# Patient Record
Sex: Female | Born: 1980 | Race: White | Hispanic: No | Smoking: Former smoker
Health system: Southern US, Community
[De-identification: ages and names within clinical notes are randomized; demographics above are authoritative.]

## PROBLEM LIST (undated history)

## (undated) DIAGNOSIS — T7840XA Allergy, unspecified, initial encounter: Secondary | ICD-10-CM

## (undated) DIAGNOSIS — N926 Irregular menstruation, unspecified: Secondary | ICD-10-CM

## (undated) DIAGNOSIS — R011 Cardiac murmur, unspecified: Secondary | ICD-10-CM

## (undated) DIAGNOSIS — E042 Nontoxic multinodular goiter: Secondary | ICD-10-CM

## (undated) HISTORY — DX: Allergy, unspecified, initial encounter: T78.40XA

## (undated) HISTORY — DX: Irregular menstruation, unspecified: N92.6

## (undated) HISTORY — DX: Nontoxic multinodular goiter: E04.2

## (undated) HISTORY — DX: Cardiac murmur, unspecified: R01.1

---
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
## 2001-08-17 ENCOUNTER — Emergency Department (HOSPITAL_COMMUNITY): Admission: EM | Admit: 2001-08-17 | Discharge: 2001-08-17 | Payer: Self-pay | Admitting: Emergency Medicine

## 2001-08-17 ENCOUNTER — Encounter: Payer: Self-pay | Admitting: Emergency Medicine

## 2002-06-09 ENCOUNTER — Emergency Department (HOSPITAL_COMMUNITY): Admission: EM | Admit: 2002-06-09 | Discharge: 2002-06-09 | Payer: Self-pay

## 2002-10-27 ENCOUNTER — Emergency Department (HOSPITAL_COMMUNITY): Admission: EM | Admit: 2002-10-27 | Discharge: 2002-10-27 | Payer: Self-pay | Admitting: Emergency Medicine

## 2003-10-28 ENCOUNTER — Emergency Department (HOSPITAL_COMMUNITY): Admission: EM | Admit: 2003-10-28 | Discharge: 2003-10-28 | Payer: Self-pay | Admitting: *Deleted

## 2004-07-07 ENCOUNTER — Other Ambulatory Visit: Admission: RE | Admit: 2004-07-07 | Discharge: 2004-07-07 | Payer: Self-pay | Admitting: Obstetrics and Gynecology

## 2005-08-16 ENCOUNTER — Other Ambulatory Visit: Admission: RE | Admit: 2005-08-16 | Discharge: 2005-08-16 | Payer: Self-pay | Admitting: Obstetrics and Gynecology

## 2011-08-17 ENCOUNTER — Ambulatory Visit (INDEPENDENT_AMBULATORY_CARE_PROVIDER_SITE_OTHER): Payer: BC Managed Care – PPO

## 2011-08-17 DIAGNOSIS — Z4802 Encounter for removal of sutures: Secondary | ICD-10-CM

## 2011-09-15 ENCOUNTER — Ambulatory Visit (INDEPENDENT_AMBULATORY_CARE_PROVIDER_SITE_OTHER): Payer: BC Managed Care – PPO | Admitting: Family Medicine

## 2011-09-15 DIAGNOSIS — IMO0002 Reserved for concepts with insufficient information to code with codable children: Secondary | ICD-10-CM

## 2011-09-25 ENCOUNTER — Encounter (HOSPITAL_COMMUNITY): Payer: Self-pay

## 2011-09-25 ENCOUNTER — Emergency Department (HOSPITAL_COMMUNITY)
Admission: EM | Admit: 2011-09-25 | Discharge: 2011-09-25 | Disposition: A | Discharge status: Home / Self Care | Payer: BC Managed Care – PPO | Attending: Emergency Medicine | Admitting: Emergency Medicine

## 2011-09-25 DIAGNOSIS — R079 Chest pain, unspecified: Secondary | ICD-10-CM | POA: Insufficient documentation

## 2011-09-25 DIAGNOSIS — M542 Cervicalgia: Secondary | ICD-10-CM | POA: Insufficient documentation

## 2011-09-25 DIAGNOSIS — B9789 Other viral agents as the cause of diseases classified elsewhere: Secondary | ICD-10-CM | POA: Insufficient documentation

## 2011-09-25 DIAGNOSIS — S139XXA Sprain of joints and ligaments of unspecified parts of neck, initial encounter: Secondary | ICD-10-CM | POA: Insufficient documentation

## 2011-09-25 DIAGNOSIS — IMO0001 Reserved for inherently not codable concepts without codable children: Secondary | ICD-10-CM | POA: Insufficient documentation

## 2011-09-25 DIAGNOSIS — R05 Cough: Secondary | ICD-10-CM | POA: Insufficient documentation

## 2011-09-25 DIAGNOSIS — R070 Pain in throat: Secondary | ICD-10-CM | POA: Insufficient documentation

## 2011-09-25 DIAGNOSIS — B349 Viral infection, unspecified: Secondary | ICD-10-CM

## 2011-09-25 DIAGNOSIS — J3489 Other specified disorders of nose and nasal sinuses: Secondary | ICD-10-CM | POA: Insufficient documentation

## 2011-09-25 DIAGNOSIS — X58XXXA Exposure to other specified factors, initial encounter: Secondary | ICD-10-CM | POA: Insufficient documentation

## 2011-09-25 DIAGNOSIS — R059 Cough, unspecified: Secondary | ICD-10-CM | POA: Insufficient documentation

## 2011-09-25 DIAGNOSIS — S161XXA Strain of muscle, fascia and tendon at neck level, initial encounter: Secondary | ICD-10-CM

## 2011-09-25 DIAGNOSIS — R6883 Chills (without fever): Secondary | ICD-10-CM | POA: Insufficient documentation

## 2011-09-25 LAB — CBC
Hemoglobin: 12.3 g/dL (ref 12.0–15.0)
MCH: 30.3 pg (ref 26.0–34.0)
MCV: 88.7 fL (ref 78.0–100.0)
RBC: 4.06 MIL/uL (ref 3.87–5.11)
WBC: 9.3 10*3/uL (ref 4.0–10.5)

## 2011-09-25 LAB — DIFFERENTIAL
Eosinophils Absolute: 0 10*3/uL (ref 0.0–0.7)
Eosinophils Relative: 0 % (ref 0–5)
Lymphocytes Relative: 7 % — ABNORMAL LOW (ref 12–46)
Lymphs Abs: 0.7 10*3/uL (ref 0.7–4.0)
Monocytes Relative: 4 % (ref 3–12)
Neutrophils Relative %: 88 % — ABNORMAL HIGH (ref 43–77)

## 2011-09-25 LAB — POCT I-STAT, CHEM 8
BUN: <3 mg/dL — ABNORMAL LOW (ref 6–23)
Creatinine, Ser: 0.6 mg/dL (ref 0.50–1.10)
Potassium: 3.4 mEq/L — ABNORMAL LOW (ref 3.5–5.1)
Sodium: 141 mEq/L (ref 135–145)

## 2011-09-25 MED ORDER — CYCLOBENZAPRINE HCL 10 MG PO TABS: 10.0000 mg | ORAL_TABLET | Freq: Two times a day (BID) | ORAL | Status: AC | PRN

## 2011-09-25 MED ORDER — METHOCARBAMOL 500 MG PO TABS
500.0000 mg | ORAL_TABLET | Freq: Once | ORAL | Status: AC
  Administered 2011-09-25: 500 mg via ORAL
  Filled 2011-09-25: qty 1

## 2011-09-25 MED ORDER — KETOROLAC TROMETHAMINE 60 MG/2ML IM SOLN
60.0000 mg | Freq: Once | INTRAMUSCULAR | Status: AC
  Administered 2011-09-25: 60 mg via INTRAMUSCULAR
  Filled 2011-09-25: qty 2

## 2011-09-25 MED ORDER — HYDROCODONE-ACETAMINOPHEN 5-325 MG PO TABS: 1.0000 | ORAL_TABLET | Freq: Four times a day (QID) | ORAL | Status: AC | PRN

## 2011-09-25 MED ORDER — DEXAMETHASONE SODIUM PHOSPHATE 10 MG/ML IJ SOLN
10.0000 mg | Freq: Once | INTRAMUSCULAR | Status: AC
  Administered 2011-09-25: 10 mg via INTRAMUSCULAR
  Filled 2011-09-25: qty 1

## 2011-09-25 NOTE — ED Notes (Signed)
Pt states generalized body aches for 2 days. Today pt woke with neck pain without injury. Pt expresses concern for meningitis. Pt MAE randomly. Family at bedside.

## 2011-09-25 NOTE — ED Provider Notes (Signed)
History     CSN: [id number]  Arrival date & time 09/25/11  0820   First MD Initiated Contact with Patient 09/25/11 0832     9:00am HPI Patient reports feeling sick for 2 days. Reports symptoms feel like she is developing a cold. States what concerns her is severe neck pain that began yesterday. Reports pain is midline. Denies headache, fever, positive sick contacts. Reports mild chest pain with coughing, and sore throat. Patient is a 31 y.o. female presenting with flu symptoms.  Influenza This is a new problem. Episode onset: 2 days. The problem occurs constantly. The problem has been gradually improving. Associated symptoms include chest pain, chills, congestion, coughing, myalgias, neck pain and a sore throat. Pertinent negatives include no abdominal pain, fever, headaches, nausea, numbness, rash, vomiting or weakness.    History reviewed. No pertinent past medical history.  History reviewed. No pertinent past surgical history.  No family history on file.  History  Substance Use Topics  . Smoking status: Former Smoker -- 1.0 packs/day    Quit date: 09/24/2010  . Smokeless tobacco: Not on file  . Alcohol Use: No    OB History    Grav Para Term Preterm Abortions TAB SAB Ect Mult Living                  Review of Systems  Constitutional: Positive for chills. Negative for fever.  HENT: Positive for congestion, sore throat, neck pain and neck stiffness. Negative for trouble swallowing and dental problem.   Respiratory: Positive for cough. Negative for wheezing.   Cardiovascular: Positive for chest pain.  Gastrointestinal: Negative for nausea, vomiting and abdominal pain.  Genitourinary: Negative for dysuria, urgency and frequency.  Musculoskeletal: Positive for myalgias.  Skin: Negative for rash.  Neurological: Negative for dizziness, speech difficulty, weakness, numbness and headaches.  All other systems reviewed and are negative.    Allergies  Review of patient's  allergies indicates no known allergies.  Home Medications   Current Outpatient Rx  Name Route Sig Dispense Refill  . DIPHENHYDRAMINE HCL 25 MG PO TABS Oral Take 25 mg by mouth every 6 (six) hours as needed. For allergies    . NORETHINDRON-ETHINYL ESTRAD-FE 1-20/1-30/1-35 MG-MCG PO TABS Oral Take 1 tablet by mouth daily.      BP 131/79  Pulse 115  Temp(Src) 98.6 F (37 C) (Oral)  Resp 16  SpO2 98%  LMP 09/13/2011  Physical Exam  Vitals reviewed. Constitutional: She is oriented to person, place, and time. Vital signs are normal. She appears well-developed and well-nourished.  HENT:  Head: Normocephalic and atraumatic.  Right Ear: External ear normal.  Left Ear: External ear normal.  Nose: Nose normal.  Mouth/Throat: Posterior oropharyngeal erythema present. No oropharyngeal exudate.  Eyes: Conjunctivae and EOM are normal. Pupils are equal, round, and reactive to light.  Neck: Normal range of motion. Neck supple. Spinous process tenderness present. No muscular tenderness present. No rigidity.       Reports midline neck pain with full flexion.  Cardiovascular: Normal rate, regular rhythm and normal heart sounds.  Exam reveals no friction rub.   No murmur heard. Pulmonary/Chest: Effort normal and breath sounds normal. She has no wheezes. She has no rhonchi. She has no rales. She exhibits no tenderness.  Musculoskeletal: Normal range of motion.  Neurological: She is alert and oriented to person, place, and time. Coordination normal.  Skin: Skin is warm and dry. No rash noted. No erythema. No pallor.    ED Course  Procedures   Results for orders placed during the hospital encounter of 09/25/11  RAPID STREP SCREEN      Component Value Range   Streptococcus, Group A Screen (Direct) NEGATIVE  NEGATIVE   POCT I-STAT, CHEM 8      Component Value Range   Sodium 141  135 - 145 (mEq/L)   Potassium 3.4 (*) 3.5 - 5.1 (mEq/L)   Chloride 105  96 - 112 (mEq/L)   BUN <3 (*) 6 - 23  (mg/dL)   Creatinine, Ser 0.27  0.50 - 1.10 (mg/dL)   Glucose, Bld 253 (*) 70 - 99 (mg/dL)   Calcium, Ion 6.64 (*) 1.12 - 1.32 (mmol/L)   TCO2 23  0 - 100 (mmol/L)   Hemoglobin 12.2  12.0 - 15.0 (g/dL)   HCT 40.3  47.4 - 25.9 (%)  CBC      Component Value Range   WBC 9.3  4.0 - 10.5 (K/uL)   RBC 4.06  3.87 - 5.11 (MIL/uL)   Hemoglobin 12.3  12.0 - 15.0 (g/dL)   HCT 56.3  87.5 - 64.3 (%)   MCV 88.7  78.0 - 100.0 (fL)   MCH 30.3  26.0 - 34.0 (pg)   MCHC 34.2  30.0 - 36.0 (g/dL)   RDW 32.9  51.8 - 84.1 (%)   Platelets 280  150 - 400 (K/uL)  DIFFERENTIAL      Component Value Range   Neutrophils Relative 88 (*) 43 - 77 (%)   Neutro Abs 8.2 (*) 1.7 - 7.7 (K/uL)   Lymphocytes Relative 7 (*) 12 - 46 (%)   Lymphs Abs 0.7  0.7 - 4.0 (K/uL)   Monocytes Relative 4  3 - 12 (%)   Monocytes Absolute 0.4  0.1 - 1.0 (K/uL)   Eosinophils Relative 0  0 - 5 (%)   Eosinophils Absolute 0.0  0.0 - 0.7 (K/uL)   Basophils Relative 0  0 - 1 (%)   Basophils Absolute 0.0  0.0 - 0.1 (K/uL)     MDM  10:10 AM  Discussed negative result Dr. Denton Lank. Unlikely to have Meningitis in the abcense of fever and severe headaches. With patient history likely she has a viral infection and cervical neck strain. Will Give muscle relaxants, ibuprofen and analgesics. Discussed warning signs and reasons to return to the ED with patient. Agrees with plan and is ready for d/c   Abe People, PA-C 09/25/11 1016

## 2011-09-26 NOTE — ED Provider Notes (Signed)
Medical screening examination/treatment/procedure(s) were performed by non-physician practitioner and as supervising physician I was immediately available for consultation/collaboration.  Suzi Roots, MD 09/26/11 1045

## 2011-11-10 ENCOUNTER — Other Ambulatory Visit: Payer: Self-pay | Admitting: Obstetrics & Gynecology

## 2011-11-10 ENCOUNTER — Other Ambulatory Visit: Payer: Self-pay | Admitting: Nurse Practitioner

## 2011-11-10 DIAGNOSIS — E049 Nontoxic goiter, unspecified: Secondary | ICD-10-CM

## 2011-11-11 ENCOUNTER — Ambulatory Visit
Admission: RE | Admit: 2011-11-11 | Discharge: 2011-11-11 | Disposition: A | Discharge status: Home / Self Care | Payer: BC Managed Care – PPO | Source: Ambulatory Visit | Attending: Obstetrics & Gynecology | Admitting: Obstetrics & Gynecology

## 2011-11-11 DIAGNOSIS — E049 Nontoxic goiter, unspecified: Secondary | ICD-10-CM

## 2011-11-14 ENCOUNTER — Other Ambulatory Visit: Payer: BC Managed Care – PPO

## 2011-11-16 ENCOUNTER — Other Ambulatory Visit: Payer: Self-pay | Admitting: Endocrinology

## 2011-11-16 DIAGNOSIS — E049 Nontoxic goiter, unspecified: Secondary | ICD-10-CM

## 2011-11-28 ENCOUNTER — Ambulatory Visit (INDEPENDENT_AMBULATORY_CARE_PROVIDER_SITE_OTHER): Payer: BC Managed Care – PPO | Admitting: Internal Medicine

## 2011-11-28 ENCOUNTER — Encounter: Payer: Self-pay | Admitting: Internal Medicine

## 2011-11-28 ENCOUNTER — Telehealth: Payer: Self-pay

## 2011-11-28 VITALS — BP 124/86 | HR 80 | Temp 98.5°F | Resp 16 | Ht 64.0 in | Wt 137.0 lb

## 2011-11-28 DIAGNOSIS — F32A Depression, unspecified: Secondary | ICD-10-CM

## 2011-11-28 DIAGNOSIS — F329 Major depressive disorder, single episode, unspecified: Secondary | ICD-10-CM

## 2011-11-28 MED ORDER — DULOXETINE HCL 30 MG PO CPEP: 30.0000 mg | ORAL_CAPSULE | Freq: Every day | ORAL | Status: AC

## 2011-11-28 NOTE — Patient Instructions (Signed)

## 2011-11-28 NOTE — Telephone Encounter (Signed)
FYI.. Patient called stating that she received samples of Cymbalta, read side effects and has decided not to take them. She will return unopened samples

## 2011-11-28 NOTE — Progress Notes (Signed)
  Subjective:    Patient ID: Toni Wang, female    DOB: May 18, 1981, 31 y.o.   MRN: [id number]  HPI New to me she has been seeing her GYN and Dr. Talmage Nap (ENDO) over the past two months for thyromegaly and thyroid nodules but I have no medical records today. She tells me that all of her labs have been normal yet she has a myriad of symptoms (hair loss, dry skin, fatigue, weakness, muscle aches, irritability, low libido, depression, constipation, and cold hands/feet.   Review of Systems  Constitutional: Positive for fatigue. Negative for fever, chills, diaphoresis, activity change, appetite change and unexpected weight change.  HENT: Negative.   Eyes: Negative.   Respiratory: Negative for cough, chest tightness, shortness of breath, wheezing and stridor.   Cardiovascular: Negative for chest pain, palpitations and leg swelling.  Gastrointestinal: Negative for nausea, vomiting, abdominal pain, diarrhea, constipation, blood in stool, abdominal distention and anal bleeding.  Genitourinary: Negative.   Musculoskeletal: Positive for myalgias. Negative for back pain, joint swelling, arthralgias and gait problem.  Skin: Negative for color change, pallor, rash and wound.  Neurological: Positive for weakness ("all over"). Negative for dizziness, tremors, seizures, syncope, facial asymmetry, speech difficulty, light-headedness, numbness and headaches.  Hematological: Negative for adenopathy. Does not bruise/bleed easily.  Psychiatric/Behavioral: Positive for sleep disturbance (FA's), dysphoric mood and decreased concentration. Negative for suicidal ideas, hallucinations, behavioral problems, confusion, self-injury and agitation. The patient is not nervous/anxious and is not hyperactive.        Objective:   Physical Exam  Vitals reviewed. Constitutional: She is oriented to person, place, and time. She appears well-developed and well-nourished. No distress.  HENT:  Head: Normocephalic and  atraumatic.  Mouth/Throat: Oropharynx is clear and moist. No oropharyngeal exudate.  Eyes: Conjunctivae are normal. Right eye exhibits no discharge. Left eye exhibits no discharge. No scleral icterus.  Neck: Normal range of motion. Neck supple. No JVD present. No tracheal deviation present. No thyromegaly present.  Cardiovascular: Normal rate, regular rhythm, normal heart sounds and intact distal pulses.  Exam reveals no gallop and no friction rub.   No murmur heard. Pulmonary/Chest: Effort normal and breath sounds normal. No stridor. No respiratory distress. She has no wheezes. She has no rales. She exhibits no tenderness.  Abdominal: Soft. Bowel sounds are normal. She exhibits no distension and no mass. There is no tenderness. There is no rebound and no guarding.  Musculoskeletal: Normal range of motion. She exhibits no edema and no tenderness.  Lymphadenopathy:    She has no cervical adenopathy.  Neurological: She is oriented to person, place, and time.  Skin: Skin is warm and dry. No rash noted. She is not diaphoretic. No erythema. No pallor.  Psychiatric: She has a normal mood and affect. Her behavior is normal. Judgment and thought content normal.          Assessment & Plan:

## 2011-11-29 NOTE — Assessment & Plan Note (Signed)
I have requested records from her ENDO and GYN, I did not do any labs today at her request, I asked her to consider treatment for depression with cymbalta and I have referred her for psychotherapy Davene Costain or [place])

## 2011-12-30 ENCOUNTER — Ambulatory Visit: Payer: BC Managed Care – PPO | Admitting: Internal Medicine

## 2012-05-21 ENCOUNTER — Ambulatory Visit
Admission: RE | Admit: 2012-05-21 | Discharge: 2012-05-21 | Disposition: A | Discharge status: Home / Self Care | Payer: BC Managed Care – PPO | Source: Ambulatory Visit | Attending: Endocrinology | Admitting: Endocrinology

## 2012-05-21 DIAGNOSIS — E049 Nontoxic goiter, unspecified: Secondary | ICD-10-CM

## 2012-06-29 ENCOUNTER — Encounter: Payer: Self-pay | Admitting: Family Medicine

## 2012-06-29 ENCOUNTER — Ambulatory Visit (INDEPENDENT_AMBULATORY_CARE_PROVIDER_SITE_OTHER): Payer: BC Managed Care – PPO | Admitting: Family Medicine

## 2012-06-29 VITALS — BP 128/74 | HR 95 | Temp 98.3°F | Wt 142.0 lb

## 2012-06-29 DIAGNOSIS — N390 Urinary tract infection, site not specified: Secondary | ICD-10-CM

## 2012-06-29 LAB — POCT URINALYSIS DIPSTICK
Bilirubin, UA: NEGATIVE
Leukocytes, UA: NEGATIVE
Nitrite, UA: POSITIVE
pH, UA: 5.5

## 2012-06-29 MED ORDER — CIPROFLOXACIN HCL 250 MG PO TABS: 250.0000 mg | ORAL_TABLET | Freq: Two times a day (BID) | ORAL | Status: DC

## 2012-06-29 NOTE — Patient Instructions (Addendum)
-  drink plenty of water, cranberry and blueberry juice are good too  -take antibiotic as instructed  -follow up if persistent or worsening symptoms

## 2012-06-29 NOTE — Progress Notes (Signed)
Chief Complaint  Patient presents with  . Urinary Tract Infection    buring started on yesterday     HPI:  Concern for UTI: -started last night -PCP is at [facility] office, but unable to get acute visit there -symptoms: dysuria - burning with urination, took azo -denies: Fevers, chills, nausea, vomiting, abd pain or back pain -Hx of UTI: yes, no known resistance - took cipro in the past  ROS: See pertinent positives and negatives per HPI.  No past medical history on file.  Family History  Problem Relation Age of Onset  . Hypertension Father   . Cancer Neg Hx   . Depression Neg Hx   . Diabetes Neg Hx   . Heart disease Neg Hx   . Stroke Neg Hx     History   Social History  . Marital Status: Married    Spouse Name: N/A    Number of Children: N/A  . Years of Education: N/A   Social History Main Topics  . Smoking status: Former Smoker -- 1.0 packs/day    Quit date: 09/24/2010  . Smokeless tobacco: None  . Alcohol Use: No  . Drug Use: No  . Sexually Active: Yes    Birth Control/ Protection: Pill   Other Topics Concern  . None   Social History Narrative  . None    Current outpatient prescriptions:diphenhydrAMINE (BENADRYL) 25 MG tablet, Take 25 mg by mouth every 6 (six) hours as needed. For allergies, Disp: , Rfl: ;  norethindrone-ethinyl estradiol-iron (TILIA FE) 1-20/1-30/1-35 MG-MCG tablet, Take 1 tablet by mouth daily., Disp: , Rfl: ;  ciprofloxacin (CIPRO) 250 MG tablet, Take 1 tablet (250 mg total) by mouth 2 (two) times daily., Disp: 6 tablet, Rfl: 0 DULoxetine (CYMBALTA) 30 MG capsule, Take 1 capsule (30 mg total) by mouth daily., Disp: 30 capsule, Rfl: 0  EXAM:  Filed Vitals:   06/29/12 1325  BP: 128/74  Pulse: 95  Temp: 98.3 F (36.8 C)    There is no height on file to calculate BMI.  GENERAL: vitals reviewed and listed above, alert, oriented, appears well hydrated and in no acute distress  HEENT: atraumatic, conjunttiva clear, no obvious  abnormalities on inspection of external nose and ears  ABD: soft, NTTP no CVA TTP  MS: moves all extremities without noticeable abnormality  PSYCH: pleasant and cooperative, no obvious depression or anxiety  ASSESSMENT AND PLAN:  Discussed the following assessment and plan:  1. UTI (urinary tract infection)  POCT urinalysis dipstick, Urine culture, ciprofloxacin (CIPRO) 250 MG tablet  -urine dip and hx c/w UTI, abx sent to pharmacy -Patient advised to return or notify a doctor immediately if symptoms worsen or persist or new concerns arise.  Patient Instructions  -drink plenty of water, cranberry and blueberry juice are good too  -take antibiotic as instructed  -follow up if persistent or worsening symptoms     Daizee Firmin R.

## 2012-11-15 ENCOUNTER — Other Ambulatory Visit: Payer: Self-pay | Admitting: Endocrinology

## 2012-11-15 DIAGNOSIS — E049 Nontoxic goiter, unspecified: Secondary | ICD-10-CM

## 2013-03-10 ENCOUNTER — Ambulatory Visit (INDEPENDENT_AMBULATORY_CARE_PROVIDER_SITE_OTHER): Payer: BC Managed Care – PPO | Admitting: Physician Assistant

## 2013-03-10 VITALS — BP 118/68 | HR 98 | Temp 98.4°F | Resp 16 | Ht 64.0 in | Wt 149.0 lb

## 2013-03-10 DIAGNOSIS — T148 Other injury of unspecified body region: Secondary | ICD-10-CM

## 2013-03-10 DIAGNOSIS — W57XXXA Bitten or stung by nonvenomous insect and other nonvenomous arthropods, initial encounter: Secondary | ICD-10-CM

## 2013-03-10 MED ORDER — DOXYCYCLINE HYCLATE 100 MG PO TABS: 100.0000 mg | ORAL_TABLET | Freq: Two times a day (BID) | ORAL | Status: DC

## 2013-03-10 NOTE — Progress Notes (Signed)
   [address] [place] [address]   Phone [phone]  Subjective:    Patient ID: Toni Wang, female    DOB: [birth year], 32 y.o.   MRN: [id number]  HPI Pt presents to clinic with an insect bite on her L flexor surface of her elbow.  It is itchy.  She 1st noticed it 4 days ago and it looked like a normal bug bite but then yesterday she noticed that it had a red ring around it and started to use benadryl and OTC hydrocortisone cream.  She feels like the red ring is much better this am.  The area is slightly itchy.  She lives in a [address] in [place] and does not do outdoor activities.  She has a dog but he has been sick and not outside a lot.  Pt does not feel bad at all.   Review of Systems  Constitutional: Negative for fever and chills.  HENT: Negative for congestion.   Gastrointestinal: Negative for nausea.  Musculoskeletal: Positive for myalgias.  Skin: Positive for rash.       Objective:   Physical Exam  Vitals reviewed. Constitutional: She is oriented to person, place, and time. She appears well-developed and well-nourished.  HENT:  Head: Normocephalic and atraumatic.  Right Ear: External ear normal.  Left Ear: External ear normal.  Eyes: Conjunctivae are normal.  Neck: Normal range of motion.  Pulmonary/Chest: Effort normal.  Neurological: She is alert and oriented to person, place, and time.  Skin: Skin is warm and dry. Rash noted. Rash is macular. Rash is not vesicular.     Psychiatric: She has a normal mood and affect. Her behavior is normal. Judgment and thought content normal.        Assessment & Plan:  Insect bite -I think this is probably related to a spider bite due to her lifestyle but we did talk about the small possibility of this being a tick bite and because she is better today she will continue her Benadryl and topical cortisone and if the area continues to improve and she continues to not feel bad she will not take the abx but if the other  occurs she will start the abx within 10 days.  She understands and agrees with this plan. If she starts to have HAs, arthralgias and continues rash she will RTC for labs.  Plan: doxycycline (VIBRA-TABS) 100 MG tablet  Benny Lennert PA-C 03/10/2013 9:10 AM

## 2013-03-20 ENCOUNTER — Encounter: Payer: Self-pay | Admitting: Internal Medicine

## 2013-03-20 ENCOUNTER — Ambulatory Visit (INDEPENDENT_AMBULATORY_CARE_PROVIDER_SITE_OTHER): Payer: BC Managed Care – PPO | Admitting: Internal Medicine

## 2013-03-20 ENCOUNTER — Other Ambulatory Visit (INDEPENDENT_AMBULATORY_CARE_PROVIDER_SITE_OTHER): Payer: BC Managed Care – PPO

## 2013-03-20 VITALS — BP 138/90 | HR 82 | Temp 98.8°F | Ht 64.0 in | Wt 150.0 lb

## 2013-03-20 DIAGNOSIS — R3915 Urgency of urination: Secondary | ICD-10-CM

## 2013-03-20 DIAGNOSIS — R350 Frequency of micturition: Secondary | ICD-10-CM

## 2013-03-20 DIAGNOSIS — R300 Dysuria: Secondary | ICD-10-CM

## 2013-03-20 DIAGNOSIS — N3289 Other specified disorders of bladder: Secondary | ICD-10-CM

## 2013-03-20 DIAGNOSIS — R3989 Other symptoms and signs involving the genitourinary system: Secondary | ICD-10-CM

## 2013-03-20 LAB — URINALYSIS, ROUTINE W REFLEX MICROSCOPIC
Hgb urine dipstick: NEGATIVE
Leukocytes, UA: NEGATIVE
Nitrite: NEGATIVE
RBC / HPF: NONE SEEN (ref 0–?)
Specific Gravity, Urine: 1.015 (ref 1.000–1.030)
Urobilinogen, UA: 0.2 (ref 0.0–1.0)
WBC, UA: NONE SEEN (ref 0–?)

## 2013-03-20 LAB — POCT URINALYSIS DIPSTICK
Bilirubin, UA: NEGATIVE
Glucose, UA: NEGATIVE
Leukocytes, UA: NEGATIVE
Nitrite, UA: NEGATIVE
Urobilinogen, UA: 0.2

## 2013-03-20 MED ORDER — CIPROFLOXACIN HCL 500 MG PO TABS: 500.0000 mg | ORAL_TABLET | Freq: Two times a day (BID) | ORAL | Status: DC

## 2013-03-20 NOTE — Addendum Note (Signed)
Addended by: Brenton Grills C on: 03/20/2013 11:34 AM   Modules accepted: Orders

## 2013-03-20 NOTE — Progress Notes (Signed)
HPI  Pt presents to the clinic today with c/o urinary urgency, frequency and dysuria. This started 3 days ago. She denies seeing any blood in her urine. She has taken AZO OTC which has helped. She has had UTI's in the past and reports this feels the same. She does have some associated bladder pressure but not flank pain. She denies fever, chills or body aches.   Review of Systems  Past Medical History  Diagnosis Date  . Heart murmur   . Allergy     Family History  Problem Relation Age of Onset  . Hypertension Father   . Cancer Father     throat and lung cancer  . Depression Neg Hx   . Diabetes Neg Hx   . Heart disease Neg Hx   . Stroke Neg Hx   . Cancer Mother     Breast Cancer    History   Social History  . Marital Status: Married    Spouse Name: N/A    Number of Children: N/A  . Years of Education: N/A   Occupational History  . Not on file.   Social History Main Topics  . Smoking status: Former Smoker -- 1.00 packs/day    Quit date: 09/24/2010  . Smokeless tobacco: Not on file  . Alcohol Use: No  . Drug Use: No  . Sexually Active: Yes    Birth Control/ Protection: Pill   Other Topics Concern  . Not on file   Social History Narrative  . No narrative on file    No Known Allergies  Constitutional: Denies fever, malaise, fatigue, headache or abrupt weight changes.   GU: Pt reports urgency, frequency and pain with urination. Denies burning sensation, blood in urine, odor or discharge. Skin: Denies redness, rashes, lesions or ulcercations.   No other specific complaints in a complete review of systems (except as listed in HPI above).    Objective:   Physical Exam  BP 138/90  Pulse 82  Temp(Src) 98.8 F (37.1 C) (Oral)  Ht 5\' 4"  (1.626 m)  Wt 150 lb (68.04 kg)  BMI 25.73 kg/m2  SpO2 98%  LMP 03/12/2013 Wt Readings from Last 3 Encounters:  03/20/13 150 lb (68.04 kg)  03/10/13 149 lb (67.586 kg)  06/29/12 142 lb (64.411 kg)    General: Appears  her stated age, well developed, well nourished in NAD. Cardiovascular: Normal rate and rhythm. S1,S2 noted.  No murmur, rubs or gallops noted. No JVD or BLE edema. No carotid bruits noted. Pulmonary/Chest: Normal effort and positive vesicular breath sounds. No respiratory distress. No wheezes, rales or ronchi noted.  Abdomen: Soft and nontender. Normal bowel sounds, no bruits noted. No distention or masses noted. Liver, spleen and kidneys non palpable. Tender to palpation over the bladder area. No CVA tenderness.      Assessment & Plan:   Urinary urgency, frequency and dysuria secondary to UTI, new onset  Urinalysis and urine culture eRx sent if for Cipro 500 mg BID x 5 days Continue AZO OTC Drink plenty of fluids  RTC as needed or if symptoms persist.

## 2013-03-20 NOTE — Patient Instructions (Signed)
Urinary Tract Infection  Urinary tract infections (UTIs) can develop anywhere along your urinary tract. Your urinary tract is your body's drainage system for removing wastes and extra water. Your urinary tract includes two kidneys, two ureters, a bladder, and a urethra. Your kidneys are a pair of bean-shaped organs. Each kidney is about the size of your fist. They are located below your ribs, one on each side of your spine.  CAUSES  Infections are caused by microbes, which are microscopic organisms, including fungi, viruses, and bacteria. These organisms are so small that they can only be seen through a microscope. Bacteria are the microbes that most commonly cause UTIs.  SYMPTOMS   Symptoms of UTIs may vary by age and gender of the patient and by the location of the infection. Symptoms in young women typically include a frequent and intense urge to urinate and a painful, burning feeling in the bladder or urethra during urination. Older women and men are more likely to be tired, shaky, and weak and have muscle aches and abdominal pain. A fever may mean the infection is in your kidneys. Other symptoms of a kidney infection include pain in your back or sides below the ribs, nausea, and vomiting.  DIAGNOSIS  To diagnose a UTI, your caregiver will ask you about your symptoms. Your caregiver also will ask to provide a urine sample. The urine sample will be tested for bacteria and white blood cells. White blood cells are made by your body to help fight infection.  TREATMENT   Typically, UTIs can be treated with medication. Because most UTIs are caused by a bacterial infection, they usually can be treated with the use of antibiotics. The choice of antibiotic and length of treatment depend on your symptoms and the type of bacteria causing your infection.  HOME CARE INSTRUCTIONS   If you were prescribed antibiotics, take them exactly as your caregiver instructs you. Finish the medication even if you feel better after you  have only taken some of the medication.   Drink enough water and fluids to keep your urine clear or pale yellow.   Avoid caffeine, tea, and carbonated beverages. They tend to irritate your bladder.   Empty your bladder often. Avoid holding urine for long periods of time.   Empty your bladder before and after sexual intercourse.   After a bowel movement, women should cleanse from front to back. Use each tissue only once.  SEEK MEDICAL CARE IF:    You have back pain.   You develop a fever.   Your symptoms do not begin to resolve within 3 days.  SEEK IMMEDIATE MEDICAL CARE IF:    You have severe back pain or lower abdominal pain.   You develop chills.   You have nausea or vomiting.   You have continued burning or discomfort with urination.  MAKE SURE YOU:    Understand these instructions.   Will watch your condition.   Will get help right away if you are not doing well or get worse.  Document Released: 06/08/2005 Document Revised: 02/28/2012 Document Reviewed: 10/07/2011  [employer] Patient Information 2014 [employer].

## 2013-03-27 ENCOUNTER — Telehealth: Payer: Self-pay | Admitting: Internal Medicine

## 2013-03-27 DIAGNOSIS — R3989 Other symptoms and signs involving the genitourinary system: Secondary | ICD-10-CM

## 2013-03-27 DIAGNOSIS — R3915 Urgency of urination: Secondary | ICD-10-CM

## 2013-03-27 DIAGNOSIS — R350 Frequency of micturition: Secondary | ICD-10-CM

## 2013-03-27 DIAGNOSIS — R300 Dysuria: Secondary | ICD-10-CM

## 2013-03-27 MED ORDER — CIPROFLOXACIN HCL 500 MG PO TABS: 500.0000 mg | ORAL_TABLET | Freq: Two times a day (BID) | ORAL | Status: DC

## 2013-03-27 NOTE — Telephone Encounter (Signed)
Pt called stated that she was in to see [facility] 03/20/13 and Baity gave her Cipro to take. Pt request this medication to be renew and send to [facility]. Please advise.

## 2013-03-27 NOTE — Telephone Encounter (Signed)
Please send this to [facility]

## 2013-03-27 NOTE — Telephone Encounter (Signed)
Ok to refill 

## 2013-03-27 NOTE — Telephone Encounter (Signed)
Rx refilled for Cipro.

## 2013-04-02 ENCOUNTER — Ambulatory Visit (INDEPENDENT_AMBULATORY_CARE_PROVIDER_SITE_OTHER): Payer: BC Managed Care – PPO | Admitting: Internal Medicine

## 2013-04-02 ENCOUNTER — Encounter: Payer: Self-pay | Admitting: Internal Medicine

## 2013-04-02 ENCOUNTER — Other Ambulatory Visit (INDEPENDENT_AMBULATORY_CARE_PROVIDER_SITE_OTHER): Payer: BC Managed Care – PPO

## 2013-04-02 VITALS — BP 104/74 | HR 80 | Temp 98.2°F | Resp 16 | Ht 64.0 in | Wt 136.0 lb

## 2013-04-02 DIAGNOSIS — R300 Dysuria: Secondary | ICD-10-CM

## 2013-04-02 DIAGNOSIS — Z0000 Encounter for general adult medical examination without abnormal findings: Secondary | ICD-10-CM

## 2013-04-02 DIAGNOSIS — R7309 Other abnormal glucose: Secondary | ICD-10-CM

## 2013-04-02 LAB — COMPREHENSIVE METABOLIC PANEL
Albumin: 3.7 g/dL (ref 3.5–5.2)
Alkaline Phosphatase: 57 U/L (ref 39–117)
BUN: 5 mg/dL — ABNORMAL LOW (ref 6–23)
Calcium: 9.4 mg/dL (ref 8.4–10.5)
Chloride: 105 mEq/L (ref 96–112)
Creatinine, Ser: 0.4 mg/dL (ref 0.4–1.2)
Glucose, Bld: 83 mg/dL (ref 70–99)
Potassium: 4.1 mEq/L (ref 3.5–5.1)

## 2013-04-02 LAB — CBC WITH DIFFERENTIAL/PLATELET
Basophils Relative: 0.8 % (ref 0.0–3.0)
Eosinophils Relative: 0.2 % (ref 0.0–5.0)
Hemoglobin: 12.5 g/dL (ref 12.0–15.0)
Lymphocytes Relative: 19.6 % (ref 12.0–46.0)
Neutro Abs: 9.3 10*3/uL — ABNORMAL HIGH (ref 1.4–7.7)
Neutrophils Relative %: 75.8 % (ref 43.0–77.0)
RBC: 4.18 Mil/uL (ref 3.87–5.11)
WBC: 12.2 10*3/uL — ABNORMAL HIGH (ref 4.5–10.5)

## 2013-04-02 LAB — POCT URINALYSIS DIPSTICK
Ketones, UA: NEGATIVE
Protein, UA: NEGATIVE
Spec Grav, UA: <=1.005
Urobilinogen, UA: 0.2

## 2013-04-02 LAB — LIPID PANEL
Cholesterol: 206 mg/dL — ABNORMAL HIGH (ref 0–200)
HDL: 48.3 mg/dL (ref >39.00)
VLDL: 15.8 mg/dL (ref 0.0–40.0)

## 2013-04-02 LAB — HEMOGLOBIN A1C: Hgb A1c MFr Bld: 5.3 % (ref 4.6–6.5)

## 2013-04-02 LAB — TSH: TSH: 1.5 u[IU]/mL (ref 0.35–5.50)

## 2013-04-02 MED ORDER — NITROFURANTOIN MONOHYD MACRO 100 MG PO CAPS: 100.0000 mg | ORAL_CAPSULE | Freq: Two times a day (BID) | ORAL | Status: DC

## 2013-04-02 NOTE — Patient Instructions (Signed)
Preventive Care for Adults, Female A healthy lifestyle and preventive care can promote health and wellness. Preventive health guidelines for women include the following key practices.  A routine yearly physical is a good way to check with your caregiver about your health and preventive screening. It is a chance to share any concerns and updates on your health, and to receive a thorough exam.  Visit your dentist for a routine exam and preventive care every 6 months. Brush your teeth twice a day and floss once a day. Good oral hygiene prevents tooth decay and gum disease.  The frequency of eye exams is based on your age, health, family medical history, use of contact lenses, and other factors. Follow your caregiver's recommendations for frequency of eye exams.  Eat a healthy diet. Foods like vegetables, fruits, whole grains, low-fat dairy products, and lean protein foods contain the nutrients you need without too many calories. Decrease your intake of foods high in solid fats, added sugars, and salt. Eat the right amount of calories for you.Get information about a proper diet from your caregiver, if necessary.  Regular physical exercise is one of the most important things you can do for your health. Most adults should get at least 150 minutes of moderate-intensity exercise (any activity that increases your heart rate and causes you to sweat) each week. In addition, most adults need muscle-strengthening exercises on 2 or more days a week.  Maintain a healthy weight. The body mass index (BMI) is a screening tool to identify possible weight problems. It provides an estimate of body fat based on height and weight. Your caregiver can help determine your BMI, and can help you achieve or maintain a healthy weight.For adults 20 years and older:  A BMI below 18.5 is considered underweight.  A BMI of 18.5 to 24.9 is normal.  A BMI of 25 to 29.9 is considered overweight.  A BMI of 30 and above is  considered obese.  Maintain normal blood lipids and cholesterol levels by exercising and minimizing your intake of saturated fat. Eat a balanced diet with plenty of fruit and vegetables. Blood tests for lipids and cholesterol should begin at age 20 and be repeated every 5 years. If your lipid or cholesterol levels are high, you are over 50, or you are at high risk for heart disease, you may need your cholesterol levels checked more frequently.Ongoing high lipid and cholesterol levels should be treated with medicines if diet and exercise are not effective.  If you smoke, find out from your caregiver how to quit. If you do not use tobacco, do not start.  If you are pregnant, do not drink alcohol. If you are breastfeeding, be very cautious about drinking alcohol. If you are not pregnant and choose to drink alcohol, do not exceed 1 drink per day. One drink is considered to be 12 ounces (355 mL) of beer, 5 ounces (148 mL) of wine, or 1.5 ounces (44 mL) of liquor.  Avoid use of street drugs. Do not share needles with anyone. Ask for help if you need support or instructions about stopping the use of drugs.  High blood pressure causes heart disease and increases the risk of stroke. Your blood pressure should be checked at least every 1 to 2 years. Ongoing high blood pressure should be treated with medicines if weight loss and exercise are not effective.  If you are 55 to 32 years old, ask your caregiver if you should take aspirin to prevent strokes.  Diabetes   screening involves taking a blood sample to check your fasting blood sugar level. This should be done once every 3 years, after age 45, if you are within normal weight and without risk factors for diabetes. Testing should be considered at a younger age or be carried out more frequently if you are overweight and have at least 1 risk factor for diabetes.  Breast cancer screening is essential preventive care for women. You should practice "breast  self-awareness." This means understanding the normal appearance and feel of your breasts and may include breast self-examination. Any changes detected, no matter how small, should be reported to a caregiver. Women in their 20s and 30s should have a clinical breast exam (CBE) by a caregiver as part of a regular health exam every 1 to 3 years. After age 40, women should have a CBE every year. Starting at age 40, women should consider having a mammography (breast X-ray test) every year. Women who have a family history of breast cancer should talk to their caregiver about genetic screening. Women at a high risk of breast cancer should talk to their caregivers about having magnetic resonance imaging (MRI) and a mammography every year.  The Pap test is a screening test for cervical cancer. A Pap test can show cell changes on the cervix that might become cervical cancer if left untreated. A Pap test is a procedure in which cells are obtained and examined from the lower end of the uterus (cervix).  Women should have a Pap test starting at age 21.  Between ages 21 and 29, Pap tests should be repeated every 2 years.  Beginning at age 30, you should have a Pap test every 3 years as long as the past 3 Pap tests have been normal.  Some women have medical problems that increase the chance of getting cervical cancer. Talk to your caregiver about these problems. It is especially important to talk to your caregiver if a new problem develops soon after your last Pap test. In these cases, your caregiver may recommend more frequent screening and Pap tests.  The above recommendations are the same for women who have or have not gotten the vaccine for human papillomavirus (HPV).  If you had a hysterectomy for a problem that was not cancer or a condition that could lead to cancer, then you no longer need Pap tests. Even if you no longer need a Pap test, a regular exam is a good idea to make sure no other problems are  starting.  If you are between ages 65 and 70, and you have had normal Pap tests going back 10 years, you no longer need Pap tests. Even if you no longer need a Pap test, a regular exam is a good idea to make sure no other problems are starting.  If you have had past treatment for cervical cancer or a condition that could lead to cancer, you need Pap tests and screening for cancer for at least 20 years after your treatment.  If Pap tests have been discontinued, risk factors (such as a new sexual partner) need to be reassessed to determine if screening should be resumed.  The HPV test is an additional test that may be used for cervical cancer screening. The HPV test looks for the virus that can cause the cell changes on the cervix. The cells collected during the Pap test can be tested for HPV. The HPV test could be used to screen women aged 30 years and older, and should   be used in women of any age who have unclear Pap test results. After the age of 30, women should have HPV testing at the same frequency as a Pap test.  Colorectal cancer can be detected and often prevented. Most routine colorectal cancer screening begins at the age of 50 and continues through age 75. However, your caregiver may recommend screening at an earlier age if you have risk factors for colon cancer. On a yearly basis, your caregiver may provide home test kits to check for hidden blood in the stool. Use of a small camera at the end of a tube, to directly examine the colon (sigmoidoscopy or colonoscopy), can detect the earliest forms of colorectal cancer. Talk to your caregiver about this at age 50, when routine screening begins. Direct examination of the colon should be repeated every 5 to 10 years through age 75, unless early forms of pre-cancerous polyps or small growths are found.  Hepatitis C blood testing is recommended for all people born from 1945 through 1965 and any individual with known risks for hepatitis C.  Practice  safe sex. Use condoms and avoid high-risk sexual practices to reduce the spread of sexually transmitted infections (STIs). STIs include gonorrhea, chlamydia, syphilis, trichomonas, herpes, HPV, and human immunodeficiency virus (HIV). Herpes, HIV, and HPV are viral illnesses that have no cure. They can result in disability, cancer, and death. Sexually active women aged 25 and younger should be checked for chlamydia. Older women with new or multiple partners should also be tested for chlamydia. Testing for other STIs is recommended if you are sexually active and at increased risk.  Osteoporosis is a disease in which the bones lose minerals and strength with aging. This can result in serious bone fractures. The risk of osteoporosis can be identified using a bone density scan. Women ages 65 and over and women at risk for fractures or osteoporosis should discuss screening with their caregivers. Ask your caregiver whether you should take a calcium supplement or vitamin D to reduce the rate of osteoporosis.  Menopause can be associated with physical symptoms and risks. Hormone replacement therapy is available to decrease symptoms and risks. You should talk to your caregiver about whether hormone replacement therapy is right for you.  Use sunscreen with sun protection factor (SPF) of 30 or more. Apply sunscreen liberally and repeatedly throughout the day. You should seek shade when your shadow is shorter than you. Protect yourself by wearing long sleeves, pants, a wide-brimmed hat, and sunglasses year round, whenever you are outdoors.  Once a month, do a whole body skin exam, using a mirror to look at the skin on your back. Notify your caregiver of new moles, moles that have irregular borders, moles that are larger than a pencil eraser, or moles that have changed in shape or color.  Stay current with required immunizations.  Influenza. You need a dose every fall (or winter). The composition of the flu vaccine  changes each year, so being vaccinated once is not enough.  Pneumococcal polysaccharide. You need 1 to 2 doses if you smoke cigarettes or if you have certain chronic medical conditions. You need 1 dose at age 65 (or older) if you have never been vaccinated.  Tetanus, diphtheria, pertussis (Tdap, Td). Get 1 dose of Tdap vaccine if you are younger than age 65, are over 65 and have contact with an infant, are a healthcare worker, are pregnant, or simply want to be protected from whooping cough. After that, you need a Td   booster dose every 10 years. Consult your caregiver if you have not had at least 3 tetanus and diphtheria-containing shots sometime in your life or have a deep or dirty wound.  HPV. You need this vaccine if you are a woman age 26 or younger. The vaccine is given in 3 doses over 6 months.  Measles, mumps, rubella (MMR). You need at least 1 dose of MMR if you were born in 1957 or later. You may also need a second dose.  Meningococcal. If you are age 19 to 21 and a first-year college student living in a residence hall, or have one of several medical conditions, you need to get vaccinated against meningococcal disease. You may also need additional booster doses.  Zoster (shingles). If you are age 60 or older, you should get this vaccine.  Varicella (chickenpox). If you have never had chickenpox or you were vaccinated but received only 1 dose, talk to your caregiver to find out if you need this vaccine.  Hepatitis A. You need this vaccine if you have a specific risk factor for hepatitis A virus infection or you simply wish to be protected from this disease. The vaccine is usually given as 2 doses, 6 to 18 months apart.  Hepatitis B. You need this vaccine if you have a specific risk factor for hepatitis B virus infection or you simply wish to be protected from this disease. The vaccine is given in 3 doses, usually over 6 months. Preventive Services / Frequency Ages 19 to 39  Blood  pressure check.** / Every 1 to 2 years.  Lipid and cholesterol check.** / Every 5 years beginning at age 20.  Clinical breast exam.** / Every 3 years for women in their 20s and 30s.  Pap test.** / Every 2 years from ages 21 through 29. Every 3 years starting at age 30 through age 65 or 70 with a history of 3 consecutive normal Pap tests.  HPV screening.** / Every 3 years from ages 30 through ages 65 to 70 with a history of 3 consecutive normal Pap tests.  Hepatitis C blood test.** / For any individual with known risks for hepatitis C.  Skin self-exam. / Monthly.  Influenza immunization.** / Every year.  Pneumococcal polysaccharide immunization.** / 1 to 2 doses if you smoke cigarettes or if you have certain chronic medical conditions.  Tetanus, diphtheria, pertussis (Tdap, Td) immunization. / A one-time dose of Tdap vaccine. After that, you need a Td booster dose every 10 years.  HPV immunization. / 3 doses over 6 months, if you are 26 and younger.  Measles, mumps, rubella (MMR) immunization. / You need at least 1 dose of MMR if you were born in 1957 or later. You may also need a second dose.  Meningococcal immunization. / 1 dose if you are age 19 to 21 and a first-year college student living in a residence hall, or have one of several medical conditions, you need to get vaccinated against meningococcal disease. You may also need additional booster doses.  Varicella immunization.** / Consult your caregiver.  Hepatitis A immunization.** / Consult your caregiver. 2 doses, 6 to 18 months apart.  Hepatitis B immunization.** / Consult your caregiver. 3 doses usually over 6 months. Ages 40 to 64  Blood pressure check.** / Every 1 to 2 years.  Lipid and cholesterol check.** / Every 5 years beginning at age 20.  Clinical breast exam.** / Every year after age 40.  Mammogram.** / Every year beginning at age 40   and continuing for as long as you are in good health. Consult with your  caregiver.  Pap test.** / Every 3 years starting at age 30 through age 65 or 70 with a history of 3 consecutive normal Pap tests.  HPV screening.** / Every 3 years from ages 30 through ages 65 to 70 with a history of 3 consecutive normal Pap tests.  Fecal occult blood test (FOBT) of stool. / Every year beginning at age 50 and continuing until age 75. You may not need to do this test if you get a colonoscopy every 10 years.  Flexible sigmoidoscopy or colonoscopy.** / Every 5 years for a flexible sigmoidoscopy or every 10 years for a colonoscopy beginning at age 50 and continuing until age 75.  Hepatitis C blood test.** / For all people born from 1945 through 1965 and any individual with known risks for hepatitis C.  Skin self-exam. / Monthly.  Influenza immunization.** / Every year.  Pneumococcal polysaccharide immunization.** / 1 to 2 doses if you smoke cigarettes or if you have certain chronic medical conditions.  Tetanus, diphtheria, pertussis (Tdap, Td) immunization.** / A one-time dose of Tdap vaccine. After that, you need a Td booster dose every 10 years.  Measles, mumps, rubella (MMR) immunization. / You need at least 1 dose of MMR if you were born in 1957 or later. You may also need a second dose.  Varicella immunization.** / Consult your caregiver.  Meningococcal immunization.** / Consult your caregiver.  Hepatitis A immunization.** / Consult your caregiver. 2 doses, 6 to 18 months apart.  Hepatitis B immunization.** / Consult your caregiver. 3 doses, usually over 6 months. Ages 65 and over  Blood pressure check.** / Every 1 to 2 years.  Lipid and cholesterol check.** / Every 5 years beginning at age 20.  Clinical breast exam.** / Every year after age 40.  Mammogram.** / Every year beginning at age 40 and continuing for as long as you are in good health. Consult with your caregiver.  Pap test.** / Every 3 years starting at age 30 through age 65 or 70 with a 3  consecutive normal Pap tests. Testing can be stopped between 65 and 70 with 3 consecutive normal Pap tests and no abnormal Pap or HPV tests in the past 10 years.  HPV screening.** / Every 3 years from ages 30 through ages 65 or 70 with a history of 3 consecutive normal Pap tests. Testing can be stopped between 65 and 70 with 3 consecutive normal Pap tests and no abnormal Pap or HPV tests in the past 10 years.  Fecal occult blood test (FOBT) of stool. / Every year beginning at age 50 and continuing until age 75. You may not need to do this test if you get a colonoscopy every 10 years.  Flexible sigmoidoscopy or colonoscopy.** / Every 5 years for a flexible sigmoidoscopy or every 10 years for a colonoscopy beginning at age 50 and continuing until age 75.  Hepatitis C blood test.** / For all people born from 1945 through 1965 and any individual with known risks for hepatitis C.  Osteoporosis screening.** / A one-time screening for women ages 65 and over and women at risk for fractures or osteoporosis.  Skin self-exam. / Monthly.  Influenza immunization.** / Every year.  Pneumococcal polysaccharide immunization.** / 1 dose at age 65 (or older) if you have never been vaccinated.  Tetanus, diphtheria, pertussis (Tdap, Td) immunization. / A one-time dose of Tdap vaccine if you are over   65 and have contact with an infant, are a Research scientist (physical sciences), or simply want to be protected from whooping cough. After that, you need a Td booster dose every 10 years.  Varicella immunization.** / Consult your caregiver.  Meningococcal immunization.** / Consult your caregiver.  Hepatitis A immunization.** / Consult your caregiver. 2 doses, 6 to 18 months apart.  Hepatitis B immunization.** / Check with your caregiver. 3 doses, usually over 6 months. ** Family history and personal history of risk and conditions may change your caregiver's recommendations. Document Released: 10/25/2001 Document Revised: 11/21/2011  Document Reviewed: 01/24/2011 [facility] Patient Information 2014 [place], [place]. Urinary Tract Infection Urinary tract infections (UTIs) can develop anywhere along your urinary tract. Your urinary tract is your body's drainage system for removing wastes and extra water. Your urinary tract includes two kidneys, two ureters, a bladder, and a urethra. Your kidneys are a pair of bean-shaped organs. Each kidney is about the size of your fist. They are located below your ribs, one on each side of your spine. CAUSES Infections are caused by microbes, which are microscopic organisms, including fungi, viruses, and bacteria. These organisms are so small that they can only be seen through a microscope. Bacteria are the microbes that most commonly cause UTIs. SYMPTOMS  Symptoms of UTIs may vary by age and gender of the patient and by the location of the infection. Symptoms in young women typically include a frequent and intense urge to urinate and a painful, burning feeling in the bladder or urethra during urination. Older women and men are more likely to be tired, shaky, and weak and have muscle aches and abdominal pain. A fever may mean the infection is in your kidneys. Other symptoms of a kidney infection include pain in your back or sides below the ribs, nausea, and vomiting. DIAGNOSIS To diagnose a UTI, your caregiver will ask you about your symptoms. Your caregiver also will ask to provide a urine sample. The urine sample will be tested for bacteria and white blood cells. White blood cells are made by your body to help fight infection. TREATMENT  Typically, UTIs can be treated with medication. Because most UTIs are caused by a bacterial infection, they usually can be treated with the use of antibiotics. The choice of antibiotic and length of treatment depend on your symptoms and the type of bacteria causing your infection. HOME CARE INSTRUCTIONS  If you were prescribed antibiotics, take them exactly as your  caregiver instructs you. Finish the medication even if you feel better after you have only taken some of the medication.  Drink enough water and fluids to keep your urine clear or pale yellow.  Avoid caffeine, tea, and carbonated beverages. They tend to irritate your bladder.  Empty your bladder often. Avoid holding urine for long periods of time.  Empty your bladder before and after sexual intercourse.  After a bowel movement, women should cleanse from front to back. Use each tissue only once. SEEK MEDICAL CARE IF:   You have back pain.  You develop a fever.  Your symptoms do not begin to resolve within 3 days. SEEK IMMEDIATE MEDICAL CARE IF:   You have severe back pain or lower abdominal pain.  You develop chills.  You have nausea or vomiting.  You have continued burning or discomfort with urination. MAKE SURE YOU:   Understand these instructions.  Will watch your condition.  Will get help right away if you are not doing well or get worse. Document Released: 06/08/2005 Document Revised:  02/28/2012 Document Reviewed: 10/07/2011 [employer] Patient Information 2014 [place], [place].

## 2013-04-02 NOTE — Progress Notes (Signed)
Subjective:    Patient ID: Toni Wang, female    DOB: May 30, 1981, 32 y.o.   MRN: [id number]  Dysuria  This is a recurrent problem. The current episode started 1 to 4 weeks ago. The problem occurs intermittently. The problem has been unchanged. The quality of the pain is described as burning. The pain is at a severity of 1/10. The patient is experiencing no pain. There has been no fever. The fever has been present for less than 1 day. She is sexually active. There is no history of pyelonephritis. Associated symptoms include frequency and urgency. Pertinent negatives include no chills, discharge, flank pain, hematuria, hesitancy, nausea, possible pregnancy, sweats or vomiting. She has tried antibiotics (she hs taken two courses of cipro) for the symptoms. The treatment provided no relief. Her past medical history is significant for recurrent UTIs.      Review of Systems  Constitutional: Negative.  Negative for fever, chills, diaphoresis, activity change, appetite change, fatigue and unexpected weight change.  HENT: Negative.   Eyes: Negative.   Respiratory: Negative.  Negative for cough, chest tightness, shortness of breath, wheezing and stridor.   Cardiovascular: Negative.  Negative for chest pain, palpitations and leg swelling.  Gastrointestinal: Negative.  Negative for nausea, vomiting, abdominal pain, diarrhea and constipation.  Endocrine: Negative.   Genitourinary: Positive for dysuria, urgency and frequency. Negative for hesitancy, hematuria, flank pain, decreased urine volume, vaginal bleeding, vaginal discharge, enuresis, difficulty urinating, vaginal pain, menstrual problem, pelvic pain and dyspareunia.  Musculoskeletal: Negative.   Skin: Negative.   Allergic/Immunologic: Negative.   Neurological: Negative.   Hematological: Negative.   Psychiatric/Behavioral: Negative.        Objective:   Physical Exam  Vitals reviewed. Constitutional: She is oriented to person, place,  and time. She appears well-developed and well-nourished.  Non-toxic appearance. She does not have a sickly appearance. She does not appear ill. No distress.  HENT:  Head: Normocephalic and atraumatic.  Mouth/Throat: Oropharynx is clear and moist. No oropharyngeal exudate.  Eyes: Conjunctivae are normal. Right eye exhibits no discharge. Left eye exhibits no discharge. No scleral icterus.  Neck: Normal range of motion. Neck supple. No JVD present. No tracheal deviation present. No thyromegaly present.  Cardiovascular: Normal rate, regular rhythm, normal heart sounds and intact distal pulses.  Exam reveals no gallop and no friction rub.   No murmur heard. Pulmonary/Chest: Effort normal and breath sounds normal. No stridor. No respiratory distress. She has no wheezes. She has no rales. She exhibits no tenderness.  Abdominal: Soft. Normal appearance and bowel sounds are normal. She exhibits no shifting dullness, no distension, no pulsatile liver, no fluid wave, no abdominal bruit, no ascites, no pulsatile midline mass and no mass. There is no hepatosplenomegaly, splenomegaly or hepatomegaly. There is no tenderness. There is no rebound, no guarding and no CVA tenderness. No hernia. Hernia confirmed negative in the ventral area, confirmed negative in the right inguinal area and confirmed negative in the left inguinal area.  Musculoskeletal: Normal range of motion. She exhibits no edema and no tenderness.  Lymphadenopathy:    She has no cervical adenopathy.  Neurological: She is oriented to person, place, and time.  Skin: Skin is warm and dry. No rash noted. She is not diaphoretic. No erythema. No pallor.  Psychiatric: She has a normal mood and affect. Her behavior is normal. Judgment and thought content normal.     Lab Results  Component Value Date   WBC 9.3 09/25/2011   HGB 12.3 09/25/2011  HCT 36.0 09/25/2011   PLT 280 09/25/2011   GLUCOSE 104* 09/25/2011   NA 141 09/25/2011   K 3.4* 09/25/2011   CL  105 09/25/2011   CREATININE 0.60 09/25/2011   BUN <3* 09/25/2011       Assessment & Plan:

## 2013-04-03 ENCOUNTER — Encounter: Payer: Self-pay | Admitting: Internal Medicine

## 2013-04-03 NOTE — Assessment & Plan Note (Signed)
I will check her A1C to see if she has DM2 

## 2013-04-03 NOTE — Assessment & Plan Note (Signed)
Exam done Vaccines were reviewed Labs ordered Pt ed material was given 

## 2013-04-03 NOTE — Assessment & Plan Note (Signed)
I have ordered a urine culture She will stop the cirpo and will start macrobid

## 2013-04-05 ENCOUNTER — Encounter: Payer: Self-pay | Admitting: Internal Medicine

## 2013-04-05 LAB — CULTURE, URINE COMPREHENSIVE

## 2013-05-15 ENCOUNTER — Ambulatory Visit
Admission: RE | Admit: 2013-05-15 | Discharge: 2013-05-15 | Disposition: A | Discharge status: Home / Self Care | Payer: BC Managed Care – PPO | Source: Ambulatory Visit | Attending: Endocrinology | Admitting: Endocrinology

## 2013-05-15 DIAGNOSIS — E049 Nontoxic goiter, unspecified: Secondary | ICD-10-CM

## 2013-07-02 DIAGNOSIS — E063 Autoimmune thyroiditis: Secondary | ICD-10-CM | POA: Insufficient documentation

## 2013-07-02 DIAGNOSIS — E042 Nontoxic multinodular goiter: Secondary | ICD-10-CM | POA: Insufficient documentation

## 2013-07-18 ENCOUNTER — Other Ambulatory Visit: Payer: Self-pay

## 2013-11-11 ENCOUNTER — Encounter: Payer: Self-pay | Admitting: Nurse Practitioner

## 2013-11-12 ENCOUNTER — Ambulatory Visit (INDEPENDENT_AMBULATORY_CARE_PROVIDER_SITE_OTHER): Payer: Managed Care, Other (non HMO) | Admitting: Nurse Practitioner

## 2013-11-12 ENCOUNTER — Encounter: Payer: Self-pay | Admitting: Nurse Practitioner

## 2013-11-12 VITALS — BP 137/78 | HR 97 | Resp 18 | Ht 64.75 in | Wt 148.0 lb

## 2013-11-12 DIAGNOSIS — R319 Hematuria, unspecified: Secondary | ICD-10-CM

## 2013-11-12 DIAGNOSIS — Z01419 Encounter for gynecological examination (general) (routine) without abnormal findings: Secondary | ICD-10-CM

## 2013-11-12 DIAGNOSIS — Z0000 Encounter for general adult medical examination without abnormal findings: Secondary | ICD-10-CM

## 2013-11-12 LAB — POCT URINALYSIS DIPSTICK
Bilirubin, UA: NEGATIVE
Glucose, UA: NEGATIVE
Ketones, UA: NEGATIVE
Leukocytes, UA: NEGATIVE
NITRITE UA: NEGATIVE
PH UA: 5
PROTEIN UA: NEGATIVE
UROBILINOGEN UA: NEGATIVE

## 2013-11-12 MED ORDER — NORETHINDRON-ETHINYL ESTRAD-FE 1-20/1-30/1-35 MG-MCG PO TABS: 1.0000 | ORAL_TABLET | Freq: Every day | ORAL | Status: DC

## 2013-11-12 NOTE — Progress Notes (Signed)
33 y.o. G0P0 Married Caucasian Fe here for annual exam.  Menses for 3-4 days, moderate . No cramps.  Some PMS  Patient's last menstrual period was 10/22/2013.          Sexually active: yes  The current method of family planning is OCP Tilia FE.    Exercising: yes  treadmill, walking Smoker:  yes  Health Maintenance: Pap:  11/07/2011 normal, HR HPV negative MMG:  never TDaP:  11/2008 Labs: Patient had labs at PCP this past year  UA: 2+ RBC, negative other test   reports that she has been smoking.  She has never used smokeless tobacco. She reports that she drinks alcohol. She reports that she does not use illicit drugs.  Past Medical History  Diagnosis Date  . Heart murmur   . Allergy   . Multiple thyroid nodules     Bilateral  . Irregular menses     Past Surgical History  Procedure Laterality Date  . Breast lumpectomy Left 2001    Bening    Current Outpatient Prescriptions  Medication Sig Dispense Refill  . diphenhydrAMINE (BENADRYL) 25 MG tablet Take 25 mg by mouth every 6 (six) hours as needed. For allergies      . Multiple Vitamins-Minerals (AIRBORNE PO) Take by mouth daily.      . norethindrone-ethinyl estradiol-iron (TILIA FE) 1-20/1-30/1-35 MG-MCG tablet Take 1 tablet by mouth daily.       No current facility-administered medications for this visit.    Family History  Problem Relation Age of Onset  . Hypertension Father   . Cancer Father     throat and lung cancer  . Depression Neg Hx   . Stroke Neg Hx   . Breast cancer Mother   . Diabetes Maternal Grandmother   . Breast cancer Maternal Grandmother   . Cancer Maternal Grandmother     Colon  . Heart disease Maternal Grandfather   . Cancer Paternal Grandmother     Colon    ROS:  Pertinent items are noted in HPI.  Otherwise, a comprehensive ROS was negative.  Exam:   BP 141/77  Pulse 97  Resp 18  Ht 5' 4.75" (1.645 m)  Wt 148 lb (67.132 kg)  BMI 24.81 kg/m2  LMP 10/22/2013 Height: 5' 4.75" (164.5  cm)  Ht Readings from Last 3 Encounters:  11/12/13 5' 4.75" (1.645 m)  04/02/13 5\' 4"  (1.626 m)  03/20/13 5\' 4"  (1.626 m)    General appearance: alert, cooperative and appears stated age Head: Normocephalic, without obvious abnormality, atraumatic Neck: no adenopathy, supple, symmetrical, trachea midline and thyroid normal to inspection and palpation Lungs: clear to auscultation bilaterally Breasts: normal appearance, no masses or tenderness Heart: regular rate and rhythm Abdomen: soft, non-tender; no masses,  no organomegaly Extremities: extremities normal, atraumatic, no cyanosis or edema Skin: Skin color, texture, turgor normal. No rashes or lesions Lymph nodes: Cervical, supraclavicular, and axillary nodes normal. No abnormal inguinal nodes palpated Neurologic: Grossly normal   Pelvic: External genitalia:  no lesions              Urethra:  normal appearing urethra with no masses, tenderness or lesions              Bartholin's and Skene's: normal                 Vagina: normal appearing vagina with normal color and discharge, no lesions              Cervix: anteverted  Pap taken: no Bimanual Exam:  Uterus:  normal size, contour, position, consistency, mobility, non-tender              Adnexa: no mass, fullness, tenderness               Rectovaginal: Confirms               Anus:  normal sphincter tone, no lesions  A:  Well Woman with normal exam  Contraception  Hematuria asymptomatic  FMH: breast and colon cancer - she has declined genetic testing along with her mother  P:   Pap smear as per guidelines   Mammogram start at age 28  Will send urine for C&S and micro and follow - no treatment started  Refill of OCP - 2 months at local pharmacy and 1 year printed to send to new insurance ?  Counseled with diet, exercise and weight bearing. Aware needs to stop smoking. return annually or prn  An After Visit Summary was printed and given to the patient.

## 2013-11-12 NOTE — Patient Instructions (Signed)

## 2013-11-13 LAB — URINALYSIS, MICROSCOPIC ONLY
Bacteria, UA: NONE SEEN
Casts: NONE SEEN
Crystals: NONE SEEN
SQUAMOUS EPITHELIAL / LPF: NONE SEEN

## 2013-11-13 LAB — URINE CULTURE
Colony Count: NO GROWTH
ORGANISM ID, BACTERIA: NO GROWTH

## 2013-11-13 NOTE — Progress Notes (Signed)
Encounter reviewed by Dr. Brook Silva.  

## 2014-03-11 ENCOUNTER — Telehealth: Payer: Self-pay | Admitting: Nurse Practitioner

## 2014-03-11 DIAGNOSIS — Z0189 Encounter for other specified special examinations: Secondary | ICD-10-CM

## 2014-03-11 NOTE — Telephone Encounter (Signed)
Patient is calling to ask if she can get her blood drawn for blood typing. She states she gave blood about 6 months ago and received a donor card with B+ blood type. She states that her Mother feels that her blood type is A+. Patient would like confirmation. Advised that blood type screening usually is not drawn on routine basis. She is willing to pay cost if insurance does not cover. Routing to AshlandPatricia Rolen-Grubb, FNP for review, is this something you would order?

## 2014-03-11 NOTE — Telephone Encounter (Signed)
OK to order blood type.

## 2014-03-11 NOTE — Telephone Encounter (Signed)
Patient calling to speak with nurse about questions she has re: her blood type.

## 2014-03-12 ENCOUNTER — Other Ambulatory Visit (INDEPENDENT_AMBULATORY_CARE_PROVIDER_SITE_OTHER): Payer: Managed Care, Other (non HMO)

## 2014-03-12 DIAGNOSIS — Z0189 Encounter for other specified special examinations: Secondary | ICD-10-CM

## 2014-03-12 NOTE — Telephone Encounter (Signed)
Spoke with Toni Wang, Toni Wang.  Approximate Cost for Lab draw $6.00 Blood type $49.00 Rh $35.00  Spoke with patient and advised okay with Toni FranklinPatricia Rolen-Grubb, FNP to order blood type and rh. Advised of approximate lab costs. Patient is agreeable and would like to schedule lab. She is scheduled for this afternoon for lab draw.   Routing to provider for final review. Patient agreeable to disposition. Will close encounter

## 2014-03-13 LAB — ABO AND RH: Rh Type: POSITIVE

## 2014-06-27 ENCOUNTER — Other Ambulatory Visit: Payer: Self-pay

## 2014-09-25 ENCOUNTER — Telehealth: Payer: Self-pay | Admitting: Nurse Practitioner

## 2014-09-25 NOTE — Telephone Encounter (Signed)
Pt will call back to reschedule aex. °

## 2014-11-28 ENCOUNTER — Ambulatory Visit: Payer: Managed Care, Other (non HMO) | Admitting: Nurse Practitioner

## 2014-12-04 ENCOUNTER — Encounter: Payer: Self-pay | Admitting: Nurse Practitioner

## 2014-12-04 ENCOUNTER — Ambulatory Visit (INDEPENDENT_AMBULATORY_CARE_PROVIDER_SITE_OTHER): Payer: Managed Care, Other (non HMO) | Admitting: Nurse Practitioner

## 2014-12-04 VITALS — BP 114/74 | HR 100 | Ht 64.25 in | Wt 148.2 lb

## 2014-12-04 DIAGNOSIS — Z01419 Encounter for gynecological examination (general) (routine) without abnormal findings: Secondary | ICD-10-CM | POA: Diagnosis not present

## 2014-12-04 DIAGNOSIS — Z0000 Encounter for general adult medical examination without abnormal findings: Secondary | ICD-10-CM

## 2014-12-04 LAB — POCT URINALYSIS DIPSTICK
Leukocytes, UA: NEGATIVE
UROBILINOGEN UA: NEGATIVE
pH, UA: 5

## 2014-12-04 MED ORDER — NORETHINDRON-ETHINYL ESTRAD-FE 1-20/1-30/1-35 MG-MCG PO TABS: 1.0000 | ORAL_TABLET | Freq: Every day | ORAL | Status: DC

## 2014-12-04 NOTE — Progress Notes (Signed)
34 y.o. G0P0 Married  Caucasian Fe here for annual exam.  Menses regular lasting 5 days.  Some PMS.  Recent sharp pain on left breast.   This normally occurs with menses or at midcycle. Now not occuring at all.  Felt no mass, warmth, or mass.  Not considering pregnancy ever. Has concerns about a mole on right leg unsure if it has been there before.  Patient's last menstrual period was 11/18/2014.          Sexually active: Yes.    The current method of family planning is OCP (estrogen/progesterone).    Exercising: Yes.    walking  Smoker:  Yes, occ 5/day  Health Maintenance: Pap:  11/08/11 NEG HR HPV negative  MMG:  Never had one,( previous lumpectomy 09/2000 on the left and benign) TDaP:  04/30/2009 Labs: Declined; Urine: Negative    reports that she has been smoking.  She has never used smokeless tobacco. She reports that she drinks alcohol. She reports that she does not use illicit drugs.  Past Medical History  Diagnosis Date  . Allergy   . Multiple thyroid nodules     Bilateral  . Irregular menses   . Heart murmur     as a child    Past Surgical History  Procedure Laterality Date  . Breast lumpectomy Left 2001    benign    Current Outpatient Prescriptions  Medication Sig Dispense Refill  . diphenhydrAMINE (BENADRYL) 25 MG tablet Take 25 mg by mouth every 6 (six) hours as needed. For allergies    . Multiple Vitamins-Minerals (AIRBORNE PO) Take by mouth daily.    . norethindrone-ethinyl estradiol-iron (TILIA FE) 1-20/1-30/1-35 MG-MCG tablet Take 1 tablet by mouth daily. 1 Package 0   No current facility-administered medications for this visit.    Family History  Problem Relation Age of Onset  . Hypertension Father   . Cancer Father     throat and lung cancer  . Depression Neg Hx   . Stroke Neg Hx   . Breast cancer Mother [id number]  . Diabetes Maternal Grandmother   . Breast cancer Maternal Grandmother 60  . Colon cancer Maternal Grandmother 90    Colon  . Heart disease  Maternal Grandfather   . Colon cancer Paternal Grandmother [id number]    Colon    ROS:  Pertinent items are noted in HPI.  Otherwise, a comprehensive ROS was negative.  Exam:   BP 114/74 mmHg  Pulse 100  Ht 5' 4.25" (1.632 m)  Wt 148 lb 3.2 oz (67.223 kg)  BMI 25.24 kg/m2  LMP 11/18/2014 Height: 5' 4.25" (163.2 cm) Ht Readings from Last 3 Encounters:  12/04/14 5' 4.25" (1.632 m)  11/12/13 5' 4.75" (1.645 m)  04/02/13 5\' 4"  (1.626 m)    General appearance: alert, cooperative and appears stated age Head: Normocephalic, without obvious abnormality, atraumatic Neck: no adenopathy, supple, symmetrical, trachea midline and thyroid normal to inspection and palpation Lungs: clear to auscultation bilaterally Breasts: normal appearance, no masses or tenderness, positive findings: fibrocystic changes Heart: regular rate and rhythm Abdomen: soft, non-tender; no masses,  no organomegaly Extremities: extremities normal, atraumatic, no cyanosis or edema Skin: Skin color, texture, turgor normal. No rashes or lesions. Very small flat brown mole 2 mm size right thigh. Lymph nodes: Cervical, supraclavicular, and axillary nodes normal. No abnormal inguinal nodes palpated Neurologic: Grossly normal   Pelvic: External genitalia:  no lesions              Urethra:  normal appearing urethra with no masses, tenderness or lesions              Bartholin's and Skene's: normal                 Vagina: normal appearing vagina with normal color and discharge, no lesions              Cervix: anteverted              Pap taken: Yes.   Bimanual Exam:  Uterus:  normal size, contour, position, consistency, mobility, non-tender              Adnexa: no mass, fullness, tenderness               Rectovaginal: Confirms               Anus:  normal sphincter tone, no lesions  Chaperone present: Yes  A:  Well Woman with normal exam  Contraception with OCP  Premenstrual breast tenderness - will let us know if continues -  declines further eval at this time.  In view of completely normal exam. FMH: breast and colon cancer - she has declined genetic testing along with her mother  History of thyroid nodules stable - sees Dr. Talmage Nap, last thyroid US 05/15/13  P:   Reviewed health and wellness pertinent to exam  Pap smear taken today  Refill OCP for a year  Counseled on breast self exam, use and side effects of OCP's, adequate intake of calcium and vitamin D, diet and exercise return annually or prn  An After Visit Summary was printed and given to the patient.

## 2014-12-04 NOTE — Patient Instructions (Signed)

## 2014-12-07 NOTE — Progress Notes (Signed)
Encounter reviewed by Dr. Brook Silva.  

## 2014-12-08 LAB — IPS PAP TEST WITH HPV

## 2015-04-02 ENCOUNTER — Ambulatory Visit (INDEPENDENT_AMBULATORY_CARE_PROVIDER_SITE_OTHER): Payer: Managed Care, Other (non HMO) | Admitting: Internal Medicine

## 2015-04-02 ENCOUNTER — Encounter: Payer: Self-pay | Admitting: Internal Medicine

## 2015-04-02 VITALS — BP 130/80 | HR 85 | Temp 98.7°F | Resp 14 | Wt 148.0 lb

## 2015-04-02 DIAGNOSIS — M67441 Ganglion, right hand: Secondary | ICD-10-CM | POA: Diagnosis not present

## 2015-04-02 NOTE — Patient Instructions (Signed)
Go to Web MD for information on ganglion cysts

## 2015-04-02 NOTE — Progress Notes (Signed)
   Subjective:    Patient ID: Toni Wang, female    DOB: [birth year], 34 y.o.   MRN: [id number]  HPI  She noticed a small "lump" on her thumb 1-2 weeks ago. She actually felt it. She had no other symptoms.  Review of Systems she's had no associated pain, weakness, limitation of function, change in color/temperature of the skin, fever, chills, sweats, or weight loss.     Objective:   Physical Exam She appears healthy and well-nourished. There is a punctate 1-2 millimeter ganglion over the ventral surface of the PIP joint of the thumb. This transilluminates. She has full range of motion of the hand. Strength to opposition is normal. She has no epitrochlear, axillary, or cervical lymph nodes. Radial artery pulse is excellent       Assessment & Plan:  #1 ganglion cyst of the thumb    I explained the pathophysiology of ganglion cyst and answered all questions. If the lesion enlarges in size or impairs function; referral to an orthopedic hand surgeon could be pursued.

## 2015-04-02 NOTE — Progress Notes (Signed)
Pre visit review using our clinic review tool, if applicable. No additional management support is needed unless otherwise documented below in the visit note. 

## 2015-06-03 ENCOUNTER — Ambulatory Visit (INDEPENDENT_AMBULATORY_CARE_PROVIDER_SITE_OTHER): Payer: Managed Care, Other (non HMO) | Admitting: Family

## 2015-06-03 ENCOUNTER — Encounter: Payer: Self-pay | Admitting: Family

## 2015-06-03 VITALS — BP 134/86 | HR 81 | Temp 98.2°F | Resp 18 | Ht 64.0 in | Wt 147.8 lb

## 2015-06-03 DIAGNOSIS — R21 Rash and other nonspecific skin eruption: Secondary | ICD-10-CM

## 2015-06-03 MED ORDER — METHYLPREDNISOLONE ACETATE 80 MG/ML IJ SUSP
80.0000 mg | Freq: Once | INTRAMUSCULAR | Status: AC
  Administered 2015-06-03: 80 mg via INTRAMUSCULAR

## 2015-06-03 MED ORDER — HYDROCORTISONE 2.5 % EX CREA: TOPICAL_CREAM | Freq: Two times a day (BID) | CUTANEOUS | Status: DC

## 2015-06-03 NOTE — Patient Instructions (Signed)
Thank you for choosing [facility].  Summary/Instructions:  Your prescription(s) have been submitted to your pharmacy or been printed and provided for you. Please take as directed and contact our office if you believe you are having problem(s) with the medication(s) or have any questions.  If your symptoms worsen or fail to improve, please contact our office for further instruction, or in case of emergency go directly to the emergency room at the closest medical facility.     

## 2015-06-03 NOTE — Progress Notes (Signed)
Pre visit review using our clinic review tool, if applicable. No additional management support is needed unless otherwise documented below in the visit note. 

## 2015-06-03 NOTE — Assessment & Plan Note (Signed)
Appears allergic in nature with no obvious cause that is partially controlled with benedryl. In office DepoMedrol given. Start hydrocortisone cream as needed. Follow up if symptoms worsen or fail to improve.

## 2015-06-03 NOTE — Progress Notes (Signed)
   Subjective:    Patient ID: Toni Wang, female    DOB: [birth year], 34 y.o.   MRN: [id number]  Chief Complaint  Patient presents with  . Rash    has a rash on her face that started friday then it subsided, came back monday night, states it doesn't look that bad but it itches like crazy    HPI:  Toni Wang is a 34 y.o. female who  has a past medical history of Allergy; Multiple thyroid nodules; Irregular menses; and Heart murmur. and presents today for an acute office visit.  This is a new problem. Associated symptoms of a rash located on her face has been waxing and waning for about 4 days. Reports initially noted about 4 days ago which subsided and then has returned. Described as itchy and red. Modifying factors include benedryl and Aveeno creams which do help with itching but does not make it go away completely. Denies any changes to skin or hair products. 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    No Known Allergies   Current Outpatient Prescriptions on File Prior to Visit  Medication Sig Dispense Refill  . diphenhydrAMINE (BENADRYL) 25 MG tablet Take 25 mg by mouth every 6 (six) hours as needed. For allergies    . Multiple Vitamins-Minerals (AIRBORNE PO) Take by mouth daily.    . norethindrone-ethinyl estradiol-iron (TILIA FE) 1-20/1-30/1-35 MG-MCG tablet Take 1 tablet by mouth daily. 1 Package 0   No current facility-administered medications on file prior to visit.    Review of Systems  Constitutional: Negative for fever and chills.  Respiratory: Negative for chest tightness and shortness of breath.   Cardiovascular: Negative for chest pain, palpitations and leg swelling.      Objective:    BP 134/86 mmHg  Pulse 81  Temp(Src) 98.2 F (36.8 C) (Oral)  Resp 18  Ht  (1.626 m)  Wt 147 lb 12.8 oz (67.042 kg)  BMI 25.36 kg/m2  SpO2 98% Nursing note and vital signs reviewed.  Physical Exam  Constitutional: She  is oriented to person, place, and time. She appears well-developed and well-nourished. No distress.  HENT:  Maculopapular rash with errythmatous base located on her chin and bilateral cheeks.   Cardiovascular: Normal rate, regular rhythm, normal heart sounds and intact distal pulses.   Pulmonary/Chest: Effort normal and breath sounds normal.  Neurological: She is alert and oriented to person, place, and time.  Skin: Skin is warm and dry.  Psychiatric: She has a normal mood and affect. Her behavior is normal. Judgment and thought content normal.       Assessment & Plan:   Problem List Items Addressed This Visit      Musculoskeletal and Integument   Rash and nonspecific skin eruption - Primary    Appears allergic in nature with no obvious cause that is partially controlled with benedryl. In office DepoMedrol given. Start hydrocortisone cream as needed. Follow up if symptoms worsen or fail to improve.       Relevant Medications   methylPREDNISolone acetate (DEPO-MEDROL) injection 80 mg (Completed)

## 2015-07-21 ENCOUNTER — Encounter: Payer: Self-pay | Admitting: Internal Medicine

## 2015-07-21 ENCOUNTER — Ambulatory Visit (INDEPENDENT_AMBULATORY_CARE_PROVIDER_SITE_OTHER): Payer: Managed Care, Other (non HMO) | Admitting: Internal Medicine

## 2015-07-21 VITALS — BP 110/70 | HR 84 | Temp 98.6°F | Ht 64.0 in | Wt 152.0 lb

## 2015-07-21 DIAGNOSIS — R21 Rash and other nonspecific skin eruption: Secondary | ICD-10-CM

## 2015-07-21 DIAGNOSIS — R7309 Other abnormal glucose: Secondary | ICD-10-CM | POA: Diagnosis not present

## 2015-07-21 MED ORDER — METHYLPREDNISOLONE ACETATE 80 MG/ML IJ SUSP: 80.0000 mg | Freq: Once | INTRAMUSCULAR | Status: DC

## 2015-07-21 NOTE — Progress Notes (Signed)
   Subjective:    Patient ID: Toni Wang, female    DOB: 1980/12/18, 34 y.o.   MRN: [id number]  HPI  Here with 3 days recurrent itchy mild erythema to face, mostly to the maxillary areas this time, was lower bilat facies last visit with similar. No lip or tongue swelling.  No fever, pain, no obvious food, environmental, or contact allergens.  Improved with steroid tx last visit. Pt denies chest pain, increased sob or doe, wheezing, orthopnea, PND, increased LE swelling, palpitations, dizziness or syncope.   Pt denies polydipsia, polyuria, Past Medical History  Diagnosis Date  . Allergy   . Multiple thyroid nodules     Bilateral  . Irregular menses   . Heart murmur     as a child   Past Surgical History  Procedure Laterality Date  . Breast lumpectomy Left 2001    benign    reports that she has been smoking.  She has never used smokeless tobacco. She reports that she drinks alcohol. She reports that she does not use illicit drugs. family history includes Breast cancer (age of onset: 6141) in her mother; Breast cancer (age of onset: 2360) in her maternal grandmother; Cancer in her father; Colon cancer (age of onset: 2175) in her paternal grandmother; Colon cancer (age of onset: 7090) in her maternal grandmother; Diabetes in her maternal grandmother; Heart disease in her maternal grandfather; Hypertension in her father. There is no history of Depression or Stroke. No Known Allergies Current Outpatient Prescriptions on File Prior to Visit  Medication Sig Dispense Refill  . diphenhydrAMINE (BENADRYL) 25 MG tablet Take 25 mg by mouth every 6 (six) hours as needed. For allergies    . hydrocortisone 2.5 % cream Apply topically 2 (two) times daily. 30 g 0  . norethindrone-ethinyl estradiol-iron (TILIA FE) 1-20/1-30/1-35 MG-MCG tablet Take 1 tablet by mouth daily. 1 Package 0  . Multiple Vitamins-Minerals (AIRBORNE PO) Take by mouth daily.     No current facility-administered medications on file  prior to visit.   Review of Systems All otherwise neg per pt     Objective:   Physical Exam BP 110/70 mmHg  Pulse 84  Temp(Src) 98.6 F (37 C) (Oral)  Ht 5\' 4"  (1.626 m)  Wt 152 lb (68.947 kg)  BMI 26.08 kg/m2  SpO2 97% VS noted,  Constitutional: Pt appears in no significant distress HENT: Head: NCAT.  Right Ear: External ear normal.  Left Ear: External ear normal.  Eyes: . Pupils are equal, round, and reactive to light. Conjunctivae and EOM are normal Neck: Normal range of motion. Neck supple.  Cardiovascular: Normal rate and regular rhythm.   Pulmonary/Chest: Effort normal and breath sounds without rales or wheezing.  Neurological: Pt is alert. Not confused , motor grossly intact Skin: Skin is warm. + bilat mild nontender max sinus area erythem rash, no LE edema Psychiatric: Pt behavior is normal. No agitation.     Assessment & Plan:

## 2015-07-21 NOTE — Assessment & Plan Note (Signed)
Pt to call for onset polys Lab Results  Component Value Date   HGBA1C 5.3 04/02/2013

## 2015-07-21 NOTE — Progress Notes (Signed)
Pre visit review using our clinic review tool, if applicable. No additional management support is needed unless otherwise documented below in the visit note. 

## 2015-07-21 NOTE — Patient Instructions (Signed)
You had the steroid shot today  You can also continue the topical steroid cream that you have at home  Please continue all other medications as before, and refills have been done if requested.  Please have the pharmacy call with any other refills you may need.  Please keep your appointments with your specialists as you may have planned

## 2015-07-21 NOTE — Assessment & Plan Note (Signed)
C/w recurrence allergic rash, etiology unclear, for depomedrol IM, benadryl otc prn,  to f/u any worsening symptoms or concerns

## 2015-11-17 ENCOUNTER — Other Ambulatory Visit: Payer: Self-pay | Admitting: Nurse Practitioner

## 2015-11-17 NOTE — Telephone Encounter (Signed)
Patient calling requesting a refill on her birth control to last until her AEX on 01/18/16. She declined to move her appointment up closer to her due date of 12/05/15.  Pharmacy for this request: [facility] on [address]

## 2015-11-18 MED ORDER — NORETHINDRON-ETHINYL ESTRAD-FE 1-20/1-30/1-35 MG-MCG PO TABS: 1.0000 | ORAL_TABLET | Freq: Every day | ORAL | Status: DC

## 2015-11-18 NOTE — Addendum Note (Signed)
Addended by: Shelda JakesHANNER, Caysie Minnifield E on: 11/18/2015 03:00 PM   Modules accepted: Orders

## 2015-11-18 NOTE — Telephone Encounter (Signed)
Medication refill request: OCP Last AEX:  12-04-14 Next AEX: 01-18-16 Last MMG (if hormonal medication request): N/A Refill authorized: please advise

## 2015-11-18 NOTE — Telephone Encounter (Signed)
Spoke with patient - RX was sent to wrong pharmacy- I corrected it and sent it to the correct pharmacy -eh

## 2015-11-18 NOTE — Telephone Encounter (Signed)
Patient calling to check on status of refill request.

## 2015-11-19 ENCOUNTER — Telehealth: Payer: Self-pay

## 2015-11-19 NOTE — Telephone Encounter (Signed)
Pt's prescription was printed instead of e-scribed. I have faxed the prescription over to her [facility] pharmacy at [phone].

## 2016-01-17 ENCOUNTER — Telehealth: Payer: Self-pay | Admitting: *Deleted
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
## 2016-01-18 ENCOUNTER — Ambulatory Visit: Payer: Managed Care, Other (non HMO) | Admitting: Nurse Practitioner

## 2016-01-21 ENCOUNTER — Encounter: Payer: Self-pay | Admitting: Obstetrics and Gynecology

## 2016-01-21 ENCOUNTER — Ambulatory Visit (INDEPENDENT_AMBULATORY_CARE_PROVIDER_SITE_OTHER): Payer: BLUE CROSS/BLUE SHIELD | Admitting: Obstetrics and Gynecology

## 2016-01-21 VITALS — BP 128/80 | HR 84 | Resp 15 | Ht 64.5 in | Wt 146.0 lb

## 2016-01-21 DIAGNOSIS — Z01419 Encounter for gynecological examination (general) (routine) without abnormal findings: Secondary | ICD-10-CM | POA: Diagnosis not present

## 2016-01-21 MED ORDER — NORETHINDRON-ETHINYL ESTRAD-FE 1-20/1-30/1-35 MG-MCG PO TABS: 1.0000 | ORAL_TABLET | Freq: Every day | ORAL | Status: DC

## 2016-01-21 NOTE — Progress Notes (Signed)
Patient ID: Toni Wang, female   DOB: October 11, 1980, 35 y.o.   MRN: [id number] 35 y.o. G0P0 MarriedCaucasianF here for annual exam.  No dyspareunia.  Period Cycle (Days): 28 Period Duration (Days): 4- 5 days  Period Pattern: Regular Menstrual Flow: Moderate Menstrual Control: Thin pad, Tampon Dysmenorrhea: None  She can saturate 2-3 regular tampons a day. No BTB.  Patient's last menstrual period was 01/12/2016.          Sexually active: Yes.    The current method of family planning is OCP (estrogen/progesterone).    Exercising: No.  The patient does not participate in regular exercise at present. Smoker:  yes  Health Maintenance: Pap:  12-04-14 WNL NEG HR HPV History of abnormal Pap:  no MMG:  N/A Colonoscopy:  N/A BMD:   N/A TDaP:  04-30-09 Gardasil: No   reports that she has been smoking.  She has never used smokeless tobacco. 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 Allergy   . Multiple thyroid nodules     Bilateral  . Irregular menses   . Heart murmur     as a child    Past Surgical History  Procedure Laterality Date  . Breast lumpectomy Left 2001    benign    Current Outpatient Prescriptions  Medication Sig Dispense Refill  . diphenhydrAMINE (BENADRYL) 25 MG tablet Take 25 mg by mouth every 6 (six) hours as needed. For allergies    . Multiple Vitamins-Minerals (AIRBORNE PO) Take by mouth daily.    . norethindrone-ethinyl estradiol-iron (TILIA FE) 1-20/1-30/1-35 MG-MCG tablet Take 1 tablet by mouth daily. 1 Package 1   Current Facility-Administered Medications  Medication Dose Route Frequency Provider Last Rate Last Dose  . methylPREDNISolone acetate (DEPO-MEDROL) injection 80 mg  80 mg Intramuscular Once Corwin LevinsJames W John, MD        Family History  Problem Relation Age of Onset  . Hypertension Father   . Cancer Father    throat and lung cancer  . Depression Neg Hx   . Stroke Neg Hx   . Breast cancer Mother [id number]  . Diabetes Maternal Grandmother   . Breast cancer Maternal Grandmother 60  . Colon cancer Maternal Grandmother 90    Colon  . Heart disease Maternal Grandfather   . Colon cancer Paternal Grandmother [id number]    Colon    Review of Systems  Constitutional: Negative.   HENT: Negative.   Eyes: Negative.   Respiratory: Negative.   Cardiovascular: Negative.   Gastrointestinal: Negative.   Endocrine: Negative.   Genitourinary: Negative.   Musculoskeletal: Negative.   Skin: Negative.   Allergic/Immunologic: Negative.   Neurological: Negative.   Psychiatric/Behavioral: Negative.     Exam:   BP 128/80 mmHg  Pulse 84  Resp 15  Ht 5' 4.5" (1.638 m)  Wt 146 lb (66.225 kg)  BMI 24.68 kg/m2  LMP 01/12/2016  Weight change: @WEIGHTCHANGE @ Height:   Height: 5' 4.5" (163.8 cm)  Ht Readings from Last 3 Encounters:  01/21/16 5' 4.5" (1.638 m)  07/21/15 5\' 4"  (1.626 m)  06/03/15 5\' 4"  (1.626 m)    General appearance: alert, cooperative and appears stated age Head: Normocephalic, without obvious abnormality, atraumatic Neck: no adenopathy, supple, symmetrical, trachea midline and thyroid normal to inspection and palpation Lungs: clear to auscultation bilaterally Breasts: normal appearance, no masses or tenderness Heart: regular rate and  rhythm Abdomen: soft, non-tender; bowel sounds normal; no masses,  no organomegaly Extremities: extremities normal, atraumatic, no cyanosis or edema Skin: Skin color, texture, turgor normal. No rashes or lesions Lymph nodes: Cervical, supraclavicular, and axillary nodes normal. No abnormal inguinal nodes palpated Neurologic: Grossly normal   Pelvic: External genitalia:  no lesions              Urethra:  normal appearing urethra with no masses, tenderness or lesions              Bartholins and Skenes: normal                 Vagina: normal appearing vagina with  normal color and discharge, no lesions              Cervix: no lesions               Bimanual Exam:  Uterus:  normal size, contour, position, consistency, mobility, non-tender              Adnexa: no mass, fullness, tenderness               Rectovaginal: Confirms               Anus:  normal sphincter tone, no lesions  Chaperone was present for exam.  A:  Well Woman with normal exam  P:   No pap this year  Discussed breast self exam  Discussed calcium and vit D intake  Labs and immunizations with her primary MD

## 2016-02-01 NOTE — Telephone Encounter (Signed)
Patient seen on 56-11-17 with Dr Oscar LaJertson.  Routing to provider for final review.  Will close encounter.

## 2016-06-27 ENCOUNTER — Other Ambulatory Visit: Payer: Self-pay | Admitting: *Deleted

## 2016-06-27 MED ORDER — NORETHINDRON-ETHINYL ESTRAD-FE 1-20/1-30/1-35 MG-MCG PO TABS: 1.0000 | ORAL_TABLET | Freq: Every day | ORAL | 2 refills | Status: DC

## 2016-06-27 NOTE — Telephone Encounter (Signed)
Medication refill request: OCP Last AEX:  5-11-7 Next AEX:not yet scheduled Last MMG (if hormonal medication request): N/A Refill authorized: please advise

## 2016-06-27 NOTE — Telephone Encounter (Signed)
OCP's 3 packs with 2 refills sent

## 2016-07-08 ENCOUNTER — Encounter: Payer: Self-pay | Admitting: Family

## 2016-07-08 ENCOUNTER — Ambulatory Visit (INDEPENDENT_AMBULATORY_CARE_PROVIDER_SITE_OTHER): Payer: BLUE CROSS/BLUE SHIELD | Admitting: Family

## 2016-07-08 ENCOUNTER — Ambulatory Visit (INDEPENDENT_AMBULATORY_CARE_PROVIDER_SITE_OTHER)
Admission: RE | Admit: 2016-07-08 | Discharge: 2016-07-08 | Disposition: A | Discharge status: Home / Self Care | Payer: BLUE CROSS/BLUE SHIELD | Source: Ambulatory Visit | Attending: Family | Admitting: Family

## 2016-07-08 ENCOUNTER — Ambulatory Visit: Payer: Managed Care, Other (non HMO) | Admitting: Family

## 2016-07-08 DIAGNOSIS — M25561 Pain in right knee: Secondary | ICD-10-CM | POA: Insufficient documentation

## 2016-07-08 NOTE — Assessment & Plan Note (Signed)
Right knee pain unable to be replicated on exam today. There is concern for meniscal pathology given positive McMurray's. Treat conservatively with ice/moist heat, over-the-counter anti-inflammatories as needed for discomfort and home exercise therapy. Consider a knee sleeve for compression. Obtain x-rays to rule out osteoarthritis of the unlikely given her age. Follow-up if symptoms worsen or do not improve for further imaging and treatment.

## 2016-07-08 NOTE — Progress Notes (Signed)
Subjective:    Patient ID: Toni Wang, female    DOB: 08-May-1981, 35 y.o.   MRN: [id number]  Chief Complaint  Patient presents with  . Knee Pain    right knee pain and stiffness, having intense cramping in the back of her right knee, x2 weeks     HPI:  Toni Wang is a 35 y.o. female who  has a past medical history of Allergy; Heart murmur; Irregular menses; and Multiple thyroid nodules. and presents today for an office visit.   This is a new problem. Associated symptom of pain and stiffness located in her right knee has been going on for about 2 weeks. Symptoms will generally wax and wane. Describes when she sits for long periods of time and extends her knee from a flexed position is very still. She has also had cramping located in posterior aspect of her right knee. No injury or trauma that she can recall. Modifying factors include heat which has helped. No sounds/sensations felt or heard. No previous history of injury.   No Known Allergies    Outpatient Medications Prior to Visit  Medication Sig Dispense Refill  . diphenhydrAMINE (BENADRYL) 25 MG tablet Take 25 mg by mouth every 6 (six) hours as needed. For allergies    . Multiple Vitamins-Minerals (AIRBORNE PO) Take by mouth daily.    . norethindrone-ethinyl estradiol-iron (TILIA FE) 1-20/1-30/1-35 MG-MCG tablet Take 1 tablet by mouth daily. 3 Package 2  . norethindrone-ethinyl estradiol-iron (TILIA FE) 1-20/1-30/1-35 MG-MCG tablet Take 1 tablet by mouth daily. 1 Package 0   Facility-Administered Medications Prior to Visit  Medication Dose Route Frequency Provider Last Rate Last Dose  . methylPREDNISolone acetate (DEPO-MEDROL) injection 80 mg  80 mg Intramuscular Once Corwin Levins, MD          Past Surgical History:  Procedure Laterality Date  . BREAST LUMPECTOMY Left 2001   benign      Past Medical History:  Diagnosis Date  . Allergy   . Heart murmur    as a child  . Irregular menses   . Multiple  thyroid nodules    Bilateral      Review of Systems  Constitutional: Negative for chills and fever.  Musculoskeletal:       Positive for knee pain  Neurological: Negative for weakness and numbness.      Objective:    BP 122/78 (BP Location: Left Arm, Patient Position: Sitting, Cuff Size: Normal)   Pulse 89   Temp 98.1 F (36.7 C) (Oral)   Resp 16   Ht 5' 4.5" (1.638 m)   Wt 146 lb (66.2 kg)   SpO2 98%   BMI 24.67 kg/m  Nursing note and vital signs reviewed.  Physical Exam  Constitutional: She is oriented to person, place, and time. She appears well-developed and well-nourished. No distress.  Cardiovascular: Normal rate, regular rhythm, normal heart sounds and intact distal pulses.   Pulmonary/Chest: Effort normal and breath sounds normal.  Musculoskeletal:  Right knee - no obvious deformity, discoloration, or edema. Mild tenderness the popliteal fossa. No crepitus or deformity noted. Range of motion within normal limits. Strength is 5+. Distal pulses and sensation are intact and appropriate. Ligamentous testing is negative. Questionable positive meniscal testing with discomfort.  Neurological: She is alert and oriented to person, place, and time.  Skin: Skin is warm and dry.  Psychiatric: She has a normal mood and affect. Her behavior is normal. Judgment and thought content normal.  Assessment & Plan:   Problem List Items Addressed This Visit      Other   Right knee pain    Right knee pain unable to be replicated on exam today. There is concern for meniscal pathology given positive McMurray's. Treat conservatively with ice/moist heat, over-the-counter anti-inflammatories as needed for discomfort and home exercise therapy. Consider a knee sleeve for compression. Obtain x-rays to rule out osteoarthritis of the unlikely given her age. Follow-up if symptoms worsen or do not improve for further imaging and treatment.      Relevant Orders   DG Knee Complete 4 Views  Right    Other Visit Diagnoses   None.      I am having Ms. Joelene MillinOliver maintain her diphenhydrAMINE, Multiple Vitamins-Minerals (AIRBORNE PO), and norethindrone-ethinyl estradiol-iron. We will continue to administer methylPREDNISolone acetate.    Follow-up: No Follow-up on file.  Jeanine Luzalone, Ishmail Mcmanamon, FNP

## 2016-07-08 NOTE — Patient Instructions (Addendum)
Thank you for choosing [facility].  SUMMARY AND INSTRUCTIONS:  Tumeric 500 mg   Magnesium 200 mg for muscle cramping.   Ice / moist heat x 20 minutes and after activity.   Knee sleeve as needed.   Watch caffeine intake.   Medication:  Your prescription(s) have been submitted to your pharmacy or been printed and provided for you. Please take as directed and contact our office if you believe you are having problem(s) with the medication(s) or have any questions.    Follow up:  If your symptoms worsen or fail to improve, please contact our office for further instruction, or in case of emergency go directly to the emergency room at the closest medical facility.     Generic Knee Exercises EXERCISES RANGE OF MOTION (ROM) AND STRETCHING EXERCISES These exercises may help you when beginning to rehabilitate your injury. Your symptoms may resolve with or without further involvement from your physician, physical therapist, or athletic trainer. While completing these exercises, remember:   Restoring tissue flexibility helps normal motion to return to the joints. This allows healthier, less painful movement and activity.  An effective stretch should be held for at least 30 seconds.  A stretch should never be painful. You should only feel a gentle lengthening or release in the stretched tissue. STRETCH - Knee Extension, Prone  Lie on your stomach on a firm surface, such as a bed or countertop. Place your right / left knee and leg just beyond the edge of the surface. You may wish to place a towel under the far end of your right / left thigh for comfort.  Relax your leg muscles and allow gravity to straighten your knee. Your clinician may advise you to add an ankle weight if more resistance is helpful for you.  You should feel a stretch in the back of your right / left knee. Hold this position for __________ seconds. Repeat __________ times. Complete this stretch __________ times  per day. * Your physician, physical therapist, or athletic trainer may ask you to add ankle weight to enhance your stretch.  RANGE OF MOTION - Knee Flexion, Active  Lie on your back with both knees straight. (If this causes back discomfort, bend your opposite knee, placing your foot flat on the floor.)  Slowly slide your heel back toward your buttocks until you feel a gentle stretch in the front of your knee or thigh.  Hold for __________ seconds. Slowly slide your heel back to the starting position. Repeat __________ times. Complete this exercise __________ times per day.  STRETCH - Quadriceps, Prone   Lie on your stomach on a firm surface, such as a bed or padded floor.  Bend your right / left knee and grasp your ankle. If you are unable to reach your ankle or pant leg, use a belt around your foot to lengthen your reach.  Gently pull your heel toward your buttocks. Your knee should not slide out to the side. You should feel a stretch in the front of your thigh and/or knee.  Hold this position for __________ seconds. Repeat __________ times. Complete this stretch __________ times per day.  STRETCH - Hamstrings, Supine   Lie on your back. Loop a belt or towel over the ball of your right / left foot.  Straighten your right / left knee and slowly pull on the belt to raise your leg. Do not allow the right / left knee to bend. Keep your opposite leg flat on the floor.  Raise the leg until you feel a gentle stretch behind your right / left knee or thigh. Hold this position for __________ seconds. Repeat __________ times. Complete this stretch __________ times per day.  STRENGTHENING EXERCISES These exercises may help you when beginning to rehabilitate your injury. They may resolve your symptoms with or without further involvement from your physician, physical therapist, or athletic trainer. While completing these exercises, remember:   Muscles can gain both the endurance and the strength  needed for everyday activities through controlled exercises.  Complete these exercises as instructed by your physician, physical therapist, or athletic trainer. Progress the resistance and repetitions only as guided.  You may experience muscle soreness or fatigue, but the pain or discomfort you are trying to eliminate should never worsen during these exercises. If this pain does worsen, stop and make certain you are following the directions exactly. If the pain is still present after adjustments, discontinue the exercise until you can discuss the trouble with your clinician. STRENGTH - Quadriceps, Isometrics  Lie on your back with your right / left leg extended and your opposite knee bent.  Gradually tense the muscles in the front of your right / left thigh. You should see either your knee cap slide up toward your hip or increased dimpling just above the knee. This motion will push the back of the knee down toward the floor/mat/bed on which you are lying.  Hold the muscle as tight as you can without increasing your pain for __________ seconds.  Relax the muscles slowly and completely in between each repetition. Repeat __________ times. Complete this exercise __________ times per day.  STRENGTH - Quadriceps, Short Arcs   Lie on your back. Place a __________ inch towel roll under your knee so that the knee slightly bends.  Raise only your lower leg by tightening the muscles in the front of your thigh. Do not allow your thigh to rise.  Hold this position for __________ seconds. Repeat __________ times. Complete this exercise __________ times per day.  OPTIONAL ANKLE WEIGHTS: Begin with ____________________, but DO NOT exceed ____________________. Increase in 1 pound/0.5 kilogram increments.  STRENGTH - Quadriceps, Straight Leg Raises  Quality counts! Watch for signs that the quadriceps muscle is working to insure you are strengthening the correct muscles and not "cheating" by substituting with  healthier muscles.  Lay on your back with your right / left leg extended and your opposite knee bent.  Tense the muscles in the front of your right / left thigh. You should see either your knee cap slide up or increased dimpling just above the knee. Your thigh may even quiver.  Tighten these muscles even more and raise your leg 4 to 6 inches off the floor. Hold for __________ seconds.  Keeping these muscles tense, lower your leg.  Relax the muscles slowly and completely in between each repetition. Repeat __________ times. Complete this exercise __________ times per day.  STRENGTH - Hamstring, Curls  Lay on your stomach with your legs extended. (If you lay on a bed, your feet may hang over the edge.)  Tighten the muscles in the back of your thigh to bend your right / left knee up to 90 degrees. Keep your hips flat on the bed/floor.  Hold this position for __________ seconds.  Slowly lower your leg back to the starting position. Repeat __________ times. Complete this exercise __________ times per day.  OPTIONAL ANKLE WEIGHTS: Begin with ____________________, but DO NOT exceed ____________________. Increase in 1 pound/0.5 kilogram  increments.  STRENGTH - Quadriceps, Squats  Stand in a door frame so that your feet and knees are in line with the frame.  Use your hands for balance, not support, on the frame.  Slowly lower your weight, bending at the hips and knees. Keep your lower legs upright so that they are parallel with the door frame. Squat only within the range that does not increase your knee pain. Never let your hips drop below your knees.  Slowly return upright, pushing with your legs, not pulling with your hands. Repeat __________ times. Complete this exercise __________ times per day.  STRENGTH - Quadriceps, Wall Slides  Follow guidelines for form closely. Increased knee pain often results from poorly placed feet or knees.  Lean against a smooth wall or door and walk your  feet out 18-24 inches. Place your feet hip-width apart.  Slowly slide down the wall or door until your knees bend __________ degrees.* Keep your knees over your heels, not your toes, and in line with your hips, not falling to either side.  Hold for __________ seconds. Stand up to rest for __________ seconds in between each repetition. Repeat __________ times. Complete this exercise __________ times per day. * Your physician, physical therapist, or athletic trainer will alter this angle based on your symptoms and progress.   This information is not intended to replace advice given to you by your health care provider. Make sure you discuss any questions you have with your health care provider.   Document Released: 07/13/2005 Document Revised: 09/19/2014 Document Reviewed: 12/11/2008 Elsevier Interactive Patient Education [employer]

## 2016-07-10 ENCOUNTER — Encounter: Payer: Self-pay | Admitting: Family

## 2017-01-24 ENCOUNTER — Encounter: Payer: Self-pay | Admitting: Obstetrics and Gynecology

## 2017-01-24 ENCOUNTER — Ambulatory Visit (INDEPENDENT_AMBULATORY_CARE_PROVIDER_SITE_OTHER): Payer: BLUE CROSS/BLUE SHIELD | Admitting: Obstetrics and Gynecology

## 2017-01-24 VITALS — BP 116/70 | HR 88 | Resp 16 | Ht 64.25 in | Wt 151.0 lb

## 2017-01-24 DIAGNOSIS — Z803 Family history of malignant neoplasm of breast: Secondary | ICD-10-CM

## 2017-01-24 DIAGNOSIS — Z01419 Encounter for gynecological examination (general) (routine) without abnormal findings: Secondary | ICD-10-CM | POA: Diagnosis not present

## 2017-01-24 DIAGNOSIS — Z0000 Encounter for general adult medical examination without abnormal findings: Secondary | ICD-10-CM

## 2017-01-24 DIAGNOSIS — Z8639 Personal history of other endocrine, nutritional and metabolic disease: Secondary | ICD-10-CM

## 2017-01-24 DIAGNOSIS — N644 Mastodynia: Secondary | ICD-10-CM

## 2017-01-24 LAB — COMPREHENSIVE METABOLIC PANEL
ALBUMIN: 4 g/dL (ref 3.6–5.1)
ALT: 6 U/L (ref 6–29)
AST: 10 U/L (ref 10–30)
Alkaline Phosphatase: 72 U/L (ref 33–115)
BILIRUBIN TOTAL: 0.2 mg/dL (ref 0.2–1.2)
BUN: 10 mg/dL (ref 7–25)
CO2: 25 mmol/L (ref 20–31)
CREATININE: 0.49 mg/dL — AB (ref 0.50–1.10)
Calcium: 9.2 mg/dL (ref 8.6–10.2)
Chloride: 104 mmol/L (ref 98–110)
GLUCOSE: 103 mg/dL — AB (ref 65–99)
Potassium: 4.5 mmol/L (ref 3.5–5.3)
SODIUM: 138 mmol/L (ref 135–146)
Total Protein: 7.3 g/dL (ref 6.1–8.1)

## 2017-01-24 LAB — CBC
HCT: 37.6 % (ref 35.0–45.0)
HEMOGLOBIN: 12.5 g/dL (ref 11.7–15.5)
MCH: 29.5 pg (ref 27.0–33.0)
MCHC: 33.2 g/dL (ref 32.0–36.0)
MCV: 88.7 fL (ref 80.0–100.0)
MPV: 8.9 fL (ref 7.5–12.5)
PLATELETS: 432 10*3/uL — AB (ref 140–400)
RBC: 4.24 MIL/uL (ref 3.80–5.10)
RDW: 13 % (ref 11.0–15.0)
WBC: 11.7 10*3/uL — AB (ref 3.8–10.8)

## 2017-01-24 LAB — LIPID PANEL
CHOLESTEROL: 234 mg/dL — AB (ref <200)
HDL: 40 mg/dL — AB (ref >50)
LDL CALC: 158 mg/dL — AB (ref <100)
TRIGLYCERIDES: 181 mg/dL — AB (ref <150)
Total CHOL/HDL Ratio: 5.9 Ratio — ABNORMAL HIGH (ref <5.0)
VLDL: 36 mg/dL — ABNORMAL HIGH (ref <30)

## 2017-01-24 LAB — TSH: TSH: 1 mIU/L

## 2017-01-24 MED ORDER — NORETHINDRONE 0.35 MG PO TABS: 1.0000 | ORAL_TABLET | Freq: Every day | ORAL | 3 refills | Status: DC

## 2017-01-24 NOTE — Progress Notes (Signed)
Scheduled patient while in office for bilateral diagnostic mammogram with [facility] for 01/27/2017 at 3:20 pm. Patient is agreeable to date and time. Patient placed in mammogram hold.

## 2017-01-24 NOTE — Addendum Note (Signed)
Addended by: Ginny ForthSPRAGUE, KAITLYN E on: 01/24/2017 09:20 AM   Modules accepted: Orders

## 2017-01-24 NOTE — Patient Instructions (Signed)
EXERCISE AND DIET:  We recommended that you start or continue a regular exercise program for good health. Regular exercise means any activity that makes your heart beat faster and makes you sweat.  We recommend exercising at least 30 minutes per day at least 3 days a week, preferably 4 or 5.  We also recommend a diet low in fat and sugar.  Inactivity, poor dietary choices and obesity can cause diabetes, heart attack, stroke, and kidney damage, among others.    ALCOHOL AND SMOKING:  Women should limit their alcohol intake to no more than 7 drinks/beers/glasses of wine (combined, not each!) per week. Moderation of alcohol intake to this level decreases your risk of breast cancer and liver damage. And of course, no recreational drugs are part of a healthy lifestyle.  And absolutely no smoking or even second hand smoke. Most people know smoking can cause heart and lung diseases, but did you know it also contributes to weakening of your bones? Aging of your skin?  Yellowing of your teeth and nails?  CALCIUM AND VITAMIN D:  Adequate intake of calcium and Vitamin D are recommended.  The recommendations for exact amounts of these supplements seem to change often, but generally speaking 600 mg of calcium (either carbonate or citrate) and 800 units of Vitamin D per day seems prudent. Certain women may benefit from higher intake of Vitamin D.  If you are among these women, your doctor will have told you during your visit.    PAP SMEARS:  Pap smears, to check for cervical cancer or precancers,  have traditionally been done yearly, although recent scientific advances have shown that most women can have pap smears less often.  However, every woman still should have a physical exam from her gynecologist every year. It will include a breast check, inspection of the vulva and vagina to check for abnormal growths or skin changes, a visual exam of the cervix, and then an exam to evaluate the size and shape of the uterus and  ovaries.  And after 36 years of age, a rectal exam is indicated to check for rectal cancers. We will also provide age appropriate advice regarding health maintenance, like when you should have certain vaccines, screening for sexually transmitted diseases, bone density testing, colonoscopy, mammograms, etc.   MAMMOGRAMS:  All women over 40 years old should have a yearly mammogram. Many facilities now offer a "3D" mammogram, which may cost around $50 extra out of pocket. If possible,  we recommend you accept the option to have the 3D mammogram performed.  It both reduces the number of women who will be called back for extra views which then turn out to be normal, and it is better than the routine mammogram at detecting truly abnormal areas.    COLONOSCOPY:  Colonoscopy to screen for colon cancer is recommended for all women at age 50.  We know, you hate the idea of the prep.  We agree, BUT, having colon cancer and not knowing it is worse!!  Colon cancer so often starts as a polyp that can be seen and removed at colonscopy, which can quite literally save your life!  And if your first colonoscopy is normal and you have no family history of colon cancer, most women don't have to have it again for 10 years.  Once every ten years, you can do something that may end up saving your life, right?  We will be happy to help you get it scheduled when you are ready.    Be sure to check your insurance coverage so you understand how much it will cost.  It may be covered as a preventative service at no cost, but you should check your particular policy.     Norethindrone tablets (contraception) What is this medicine? NORETHINDRONE (nor eth IN drone) is an oral contraceptive. The product contains a female hormone known as a progestin. It is used to prevent pregnancy. This medicine may be used for other purposes; ask your health care provider or pharmacist if you have questions. COMMON BRAND NAME(S): Camila, Deblitane 28-Day,  Errin, Heather, Jencycla, Jolivette, Lyza, Nor-QD, Nora-BE, Norlyroc, Ortho Micronor, Sharobel 28-Day What should I tell my health care provider before I take this medicine? They need to know if you have any of these conditions: -blood vessel disease or blood clots -breast, cervical, or vaginal cancer -diabetes -heart disease -kidney disease -liver disease -mental depression -migraine -seizures -stroke -vaginal bleeding -an unusual or allergic reaction to norethindrone, other medicines, foods, dyes, or preservatives -pregnant or trying to get pregnant -breast-feeding How should I use this medicine? Take this medicine by mouth with a glass of water. You may take it with or without food. Follow the directions on the prescription label. Take this medicine at the same time each day and in the order directed on the package. Do not take your medicine more often than directed. Contact your pediatrician regarding the use of this medicine in children. Special care may be needed. This medicine has been used in female children who have started having menstrual periods. A patient package insert for the product will be given with each prescription and refill. Read this sheet carefully each time. The sheet may change frequently. Overdosage: If you think you have taken too much of this medicine contact a poison control center or emergency room at once. NOTE: This medicine is only for you. Do not share this medicine with others. What if I miss a dose? Try not to miss a dose. Every time you miss a dose or take a dose late your chance of pregnancy increases. When 1 pill is missed (even if only 3 hours late), take the missed pill as soon as possible and continue taking a pill each day at the regular time (use a back up method of birth control for the next 48 hours). If more than 1 dose is missed, use an additional birth control method for the rest of your pill pack until menses occurs. Contact your health care  professional if more than 1 dose has been missed. What may interact with this medicine? Do not take this medicine with any of the following medications: -amprenavir or fosamprenavir -bosentan This medicine may also interact with the following medications: -antibiotics or medicines for infections, especially rifampin, rifabutin, rifapentine, and griseofulvin, and possibly penicillins or tetracyclines -aprepitant -barbiturate medicines, such as phenobarbital -carbamazepine -felbamate -modafinil -oxcarbazepine -phenytoin -ritonavir or other medicines for HIV infection or AIDS -St. John's wort -topiramate This list may not describe all possible interactions. Give your health care provider a list of all the medicines, herbs, non-prescription drugs, or dietary supplements you use. Also tell them if you smoke, drink alcohol, or use illegal drugs. Some items may interact with your medicine. What should I watch for while using this medicine? Visit your doctor or health care professional for regular checks on your progress. You will need a regular breast and pelvic exam and Pap smear while on this medicine. Use an additional method of birth control during the first cycle that   you take these tablets. If you have any reason to think you are pregnant, stop taking this medicine right away and contact your doctor or health care professional. If you are taking this medicine for hormone related problems, it may take several cycles of use to see improvement in your condition. This medicine does not protect you against HIV infection (AIDS) or any other sexually transmitted diseases. What side effects may I notice from receiving this medicine? Side effects that you should report to your doctor or health care professional as soon as possible: -breast tenderness or discharge -pain in the abdomen, chest, groin or leg -severe headache -skin rash, itching, or hives -sudden shortness of breath -unusually weak  or tired -vision or speech problems -yellowing of skin or eyes Side effects that usually do not require medical attention (report to your doctor or health care professional if they continue or are bothersome): -changes in sexual desire -change in menstrual flow -facial hair growth -fluid retention and swelling -headache -irritability -nausea -weight gain or loss This list may not describe all possible side effects. Call your doctor for medical advice about side effects. You may report side effects to FDA at 1-800-FDA-1088. Where should I keep my medicine? Keep out of the reach of children. Store at room temperature between 15 and 30 degrees C (59 and 86 degrees F). Throw away any unused medicine after the expiration date. NOTE: This sheet is a summary. It may not cover all possible information. If you have questions about this medicine, talk to your doctor, pharmacist, or health care provider.  2018 Elsevier/Gold Standard (2012-05-18 16:41:35)  

## 2017-01-24 NOTE — Progress Notes (Signed)
36 y.o. G0P0 MarriedCaucasianF here for annual exam.   She c/o intermittent sharp pain in her left breast for years, can go weeks without it, then it comes and goes all day. She hasn't noticed a correlation with her cycle. The pain is always around 11 o'clock in the left breast. She does drink lots of caffeine. She hasn't noticed a lump in that area.  Cycles monthly x 2-3 days. Saturates a regular tampon in 4-5 hours. No BTB, mild cramps, helped with advil. Sexually active, no pain.     Patient's last menstrual period was 01/11/2017.          Sexually active: Yes.    The current method of family planning is OCP (estrogen/progesterone).    Exercising: Yes.    walking Smoker:  yes  Health Maintenance: Pap:   12-04-14 WNL NEG HR HPV  11/08/11 NEG HR HPV negative  History of abnormal Pap:  no MMG:  n/a Colonoscopy:  n/a BMD:   n/a TDaP:  04/30/09 Gardasil: no   reports that she has been smoking.  She has been smoking about 1.00 pack per day. She has never used smokeless tobacco. She reports that she does not drink alcohol or use drugs. She smokes off and on, none to 5 a day. Thinking about quiting. Herbalist.  Past Medical History:  Diagnosis Date  . Allergy   . Heart murmur    as a child  . Irregular menses   . Multiple thyroid nodules    Bilateral    Past Surgical History:  Procedure Laterality Date  . BREAST LUMPECTOMY Left 2001   benign    Current Outpatient Prescriptions  Medication Sig Dispense Refill  . diphenhydrAMINE (BENADRYL) 25 MG tablet Take 25 mg by mouth every 6 (six) hours as needed. For allergies    . Multiple Vitamins-Minerals (AIRBORNE PO) Take by mouth daily.    . norethindrone-ethinyl estradiol-iron (TILIA FE) 1-20/1-30/1-35 MG-MCG tablet Take 1 tablet by mouth daily. 3 Package 2   No current facility-administered medications for this visit.     Family History  Problem Relation Age of Onset  . Hypertension Father   . Cancer Father        throat  and lung cancer  . Breast cancer Mother 46  . Diabetes Maternal Grandmother   . Breast cancer Maternal Grandmother 60  . Colon cancer Maternal Grandmother 90       Colon  . Heart disease Maternal Grandfather   . Colon cancer Paternal Grandmother 45       Colon  . Depression Neg Hx   . Stroke Neg Hx   Mom never got BRCA testing.   Review of Systems  Constitutional: Negative.   HENT: Negative.   Eyes: Negative.   Respiratory: Negative.   Cardiovascular: Negative.   Gastrointestinal: Negative.   Endocrine: Negative.   Genitourinary: Negative.   Musculoskeletal: Negative.   Skin: Negative.   Allergic/Immunologic: Negative.   Neurological: Negative.   Hematological: Negative.   Psychiatric/Behavioral: Negative.     Exam:   BP 116/70 (BP Location: Right Arm, Patient Position: Sitting, Cuff Size: Normal)   Pulse 88   Resp 16   Ht 5' 4.25" (1.632 m)   Wt 151 lb (68.5 kg)   LMP 01/11/2017   BMI 25.72 kg/m   Weight change: @WEIGHTCHANGE @ Height:   Height: 5' 4.25" (163.2 cm)  Ht Readings from Last 3 Encounters:  01/24/17 5' 4.25" (1.632 m)  07/08/16 5' 4.5" (1.638 m)  01/21/16  5' 4.5" (1.638 m)    General appearance: alert, cooperative and appears stated age Head: Normocephalic, without obvious abnormality, atraumatic Neck: no adenopathy, supple, symmetrical, trachea midline and thyroid normal to inspection and palpation Lungs: clear to auscultation bilaterally Cardiovascular: regular rate and rhythm Breasts: normal appearance, no masses or tenderness Abdomen: soft, non-tender; bowel sounds normal; no masses,  no organomegaly Extremities: extremities normal, atraumatic, no cyanosis or edema Skin: Skin color, texture, turgor normal. No rashes or lesions Lymph nodes: Cervical, supraclavicular, and axillary nodes normal. No abnormal inguinal nodes palpated Neurologic: Grossly normal   Pelvic: External genitalia:  no lesions              Urethra:  normal appearing  urethra with no masses, tenderness or lesions              Bartholins and Skenes: normal                 Vagina: normal appearing vagina with normal color and discharge, no lesions              Cervix: no lesions               Bimanual Exam:  Uterus:  normal size, contour, position, consistency, mobility, non-tender              Adnexa: no mass, fullness, tenderness               Rectovaginal: Confirms               Anus:  normal sphincter tone, no lesions  Chaperone was present for exam.  A:  Well Woman with normal exam  Contraception, needs to go off OCP's secondary to smoking  Family history Breast and Colon cancer. Mom was 34 with her breast cancer diagnosis.   Intermittent pain left breast   H/O thyroid disease  P:   No pap this year  Stop OCP's, will start the minipill  Discussed possible increased risk of breast cancer on hormonal contraception  Will set up diagnostic breast imaging  Discussed genetic counseling, she declines for now  Discussed breast self exam  Discussed calcium and vit D intake  Screening labs and TSH

## 2017-01-27 ENCOUNTER — Ambulatory Visit
Admission: RE | Admit: 2017-01-27 | Discharge: 2017-01-27 | Disposition: A | Discharge status: Home / Self Care | Payer: BLUE CROSS/BLUE SHIELD | Source: Ambulatory Visit | Attending: Obstetrics and Gynecology | Admitting: Obstetrics and Gynecology

## 2017-01-27 ENCOUNTER — Other Ambulatory Visit: Payer: Self-pay | Admitting: Obstetrics and Gynecology

## 2017-01-27 DIAGNOSIS — N6320 Unspecified lump in the left breast, unspecified quadrant: Secondary | ICD-10-CM

## 2017-01-27 DIAGNOSIS — R599 Enlarged lymph nodes, unspecified: Secondary | ICD-10-CM

## 2017-01-27 DIAGNOSIS — N644 Mastodynia: Secondary | ICD-10-CM

## 2017-01-31 ENCOUNTER — Other Ambulatory Visit: Payer: Self-pay | Admitting: Obstetrics and Gynecology

## 2017-01-31 ENCOUNTER — Ambulatory Visit
Admission: RE | Admit: 2017-01-31 | Discharge: 2017-01-31 | Disposition: A | Discharge status: Home / Self Care | Payer: BLUE CROSS/BLUE SHIELD | Source: Ambulatory Visit | Attending: Obstetrics and Gynecology | Admitting: Obstetrics and Gynecology

## 2017-01-31 DIAGNOSIS — N6320 Unspecified lump in the left breast, unspecified quadrant: Secondary | ICD-10-CM

## 2017-01-31 DIAGNOSIS — R599 Enlarged lymph nodes, unspecified: Secondary | ICD-10-CM

## 2017-06-29 ENCOUNTER — Encounter: Payer: Self-pay | Admitting: Nurse Practitioner

## 2017-06-29 ENCOUNTER — Ambulatory Visit (INDEPENDENT_AMBULATORY_CARE_PROVIDER_SITE_OTHER): Payer: BLUE CROSS/BLUE SHIELD | Admitting: Nurse Practitioner

## 2017-06-29 VITALS — BP 124/78 | HR 97 | Temp 98.2°F | Ht 64.25 in | Wt 149.0 lb

## 2017-06-29 DIAGNOSIS — L03032 Cellulitis of left toe: Secondary | ICD-10-CM

## 2017-06-29 DIAGNOSIS — Z23 Encounter for immunization: Secondary | ICD-10-CM

## 2017-06-29 MED ORDER — SULFAMETHOXAZOLE-TRIMETHOPRIM 800-160 MG PO TABS: 1.0000 | ORAL_TABLET | Freq: Two times a day (BID) | ORAL | 0 refills | Status: DC

## 2017-06-29 MED ORDER — MUPIROCIN 2 % EX OINT: 1.0000 "application " | TOPICAL_OINTMENT | Freq: Two times a day (BID) | CUTANEOUS | 0 refills | Status: AC

## 2017-06-29 NOTE — Patient Instructions (Addendum)
Dressing change twice a day x 3days, then once a day.  Clean with warm water and soap.  Do warm water and epsom salt soaks twice a day x 3days before applying new dressing.  Return to office if no improvement in 1week.  Use ibuprofen or tylenol for pain.  Once swelling and pain resolves, ingrown toenail will need trimmed.  Paronychia Paronychia is an infection of the skin. It happens near a fingernail or toenail. It may cause pain and swelling around the nail. Usually, it is not serious and it clears up with treatment. Follow these instructions at home:  Soak the fingers or toes in warm water as told by your doctor. You may be told to do this for 20 minutes, 2-3 times a day.  Keep the area dry when you are not soaking it.  Take medicines only as told by your doctor.  If you were given an antibiotic medicine, finish all of it even if you start to feel better.  Keep the affected area clean.  Do not try to drain a fluid-filled bump yourself.  Wear rubber gloves when putting your hands in water.  Wear gloves if your hands might touch cleaners or chemicals.  Follow your doctor's instructions about: ? Wound care. ? Bandage (dressing) changes and removal. Contact a doctor if:  Your symptoms get worse or do not improve.  You have a fever or chills.  You have redness spreading from the affected area.  You have more fluid, blood, or pus coming from the affected area.  Your finger or knuckle is swollen or is hard to move. This information is not intended to replace advice given to you by your health care provider. Make sure you discuss any questions you have with your health care provider. Document Released: 08/17/2009 Document Revised: 02/04/2016 Document Reviewed: 08/06/2014 Elsevier Interactive Patient Education  [employer]2018 [employer]

## 2017-06-29 NOTE — Progress Notes (Signed)
Subjective:  Patient ID: Toni Wang, female    DOB: [birth year]  Age: 36 y.o. MRN: [id number]  CC: Toe Injury (left big toe cut,painful,warm to touch, going on for 3 days. tdap 2010/ flu shot?)  Toe Pain   The incident occurred 3 to 5 days ago. The incident occurred at home. The injury mechanism is unknown. The pain is present in the left toes. The quality of the pain is described as aching and stabbing. The pain is severe. The pain has been intermittent since onset. Pertinent negatives include no loss of motion, loss of sensation, muscle weakness, numbness or tingling. Associated symptoms comments: Pain with weight bearing. It is unknown if a foreign body is present. The symptoms are aggravated by movement, palpation and weight bearing. She has tried rest and heat for the symptoms. The treatment provided mild relief.    Outpatient Medications Prior to Visit  Medication Sig Dispense Refill  . diphenhydrAMINE (BENADRYL) 25 MG tablet Take 25 mg by mouth every 6 (six) hours as needed. For allergies    . Multiple Vitamins-Minerals (AIRBORNE PO) Take by mouth daily.    . norethindrone (MICRONOR,CAMILA,ERRIN) 0.35 MG tablet Take 1 tablet (0.35 mg total) by mouth daily. 3 Package 3   No facility-administered medications prior to visit.     ROS See HPI  Objective:  BP 124/78   Pulse 97   Temp 98.2 F (36.8 C)   Ht 5' 4.25" (1.632 m)   Wt 149 lb (67.6 kg)   SpO2 98%   BMI 25.38 kg/m   BP Readings from Last 3 Encounters:  06/29/17 124/78  01/24/17 116/70  07/08/16 122/78    Wt Readings from Last 3 Encounters:  06/29/17 149 lb (67.6 kg)  01/24/17 151 lb (68.5 kg)  07/08/16 146 lb (66.2 kg)    Physical Exam  Constitutional: She is oriented to person, place, and time.  Cardiovascular: Normal rate.   Pulmonary/Chest: Effort normal.  Musculoskeletal: She exhibits edema and tenderness. She exhibits no deformity.       Left ankle: Normal.       Left foot: There is tenderness  and swelling. There is normal range of motion, no bony tenderness and normal capillary refill.       Feet:  Neurological: She is alert and oriented to person, place, and time.  Skin: Skin is warm and dry. There is erythema.  Vitals reviewed.   Procedure note:  Incision and Drainage of a left great toe paronychia   Indication : a localized collection of pus that is tender and not spontaneously resolving.    Risks including unsuccessful procedure , possible need for a repeat procedure due to pus accumulation, scar formation, and others as well as benefits were explained to the patient in detail. Written consent was obtained/signed.    The patient was placed in a decubitus position. The area was prepped with povidone-iodine and draped in a sterile fashion. digital block was performed with 55m of lidocaine 2% and epinephrine.Incision made with #11 blade along lateral aspect of great toenail curticle.some purulent and sanguineous material was expressed. Irrigated with saline.   The wound was dressed with antibiotic ointment and a lrge bandaid.  Tolerated well. Complications: None.   Wound instructions provided.    Please contact [place] you notice a recollection of pus in the abscess fever and chills increased pain redness red streaks near the abscess increased swelling in the area.   Lab Results  Component Value Date   WBC  11.7 (H) 01/24/2017   HGB 12.5 01/24/2017   HCT 37.6 01/24/2017   PLT 432 (H) 01/24/2017   GLUCOSE 103 (H) 01/24/2017   CHOL 234 (H) 01/24/2017   TRIG 181 (H) 01/24/2017   HDL 40 (L) 01/24/2017   LDLDIRECT 131.6 04/02/2013   LDLCALC 158 (H) 01/24/2017   ALT 6 01/24/2017   AST 10 01/24/2017   NA 138 01/24/2017   K 4.5 01/24/2017   CL 104 01/24/2017   CREATININE 0.49 (L) 01/24/2017   BUN 10 01/24/2017   CO2 25 01/24/2017   TSH 1.00 01/24/2017   HGBA1C 5.3 04/02/2013    [place] Axillary Node Core Biopsy Left  Addendum Date: 02/01/2017   ADDENDUM REPORT:  02/01/2017 15:23 ADDENDUM: Pathology revealed FIBROADENOMA of the Left breast, 10:00 o'clock. BENIGN LYMPH NODE WITH PIGMENT DEPOSITION of the Left axilla. This was found to be concordant by Dr. Everlean Alstrom. Pathology results were discussed with the patient by telephone. The patient reported doing well after the biopsies with tenderness at the sites. Post biopsy instructions and care were reviewed and questions were answered. The patient was encouraged to call [facility] for any additional concerns. The patient was instructed to return for annual screening mammography and informed a reminder notice would be sent regarding this appointment. Consideration of genetics testing/counseling was discussed with the patient and she was given information to contact [facility]. Pathology results reported by Terie Purser, RN on 02/01/2017. Electronically Signed   By: Everlean Alstrom M.D.   On: 02/01/2017 15:23   Result Date: 02/01/2017 CLINICAL DATA:  36 year old female with indeterminate mass in the left breast at the 10 o'clock position and an indeterminate left axillary lymph node. EXAM: ULTRASOUND GUIDED LEFT BREAST CORE NEEDLE BIOPSY COMPARISON:  Previous exam(s). FINDINGS: I met with the patient and we discussed the procedure of ultrasound-guided biopsy, including benefits and alternatives. We discussed the high likelihood of a successful procedure. We discussed the risks of the procedure, including infection, bleeding, tissue injury, clip migration, and inadequate sampling. Informed written consent was given. The usual time-out protocol was performed immediately prior to the procedure. SITE 1: LEFT BREAST 10 O'CLOCK: Lesion quadrant: UPPER INNER Using sterile technique and 1% Lidocaine as local anesthetic, under direct ultrasound visualization, a 12 gauge spring-loaded device was used to perform biopsy of the mass in the left breast at the 10 o'clock position using a  medial to lateral approach. At the conclusion of the procedure a ribbon shaped tissue marker clip was deployed into the biopsy cavity. SITE 2: LEFT AXILLA: Lesion quadrant: UPPER-OUTER Using sterile technique and 1% Lidocaine as local anesthetic, under direct ultrasound visualization, a 14 gauge spring-loaded device was used to perform biopsy of the lymph node with a mildly thickened cortex in the left axilla using an inferior to superior approach. At the conclusion of the procedure a spiral shaped HydroMARK tissue marker clip was deployed into the biopsy cavity. Follow up 2 view mammogram was performed and dictated separately. IMPRESSION: 1. Ultrasound-guided biopsy of the oval mass in the left breast at the 10 o'clock position. 2. Ultrasound-guided biopsy of the lymph node with mild cortical thickening in the left axilla. Electronically Signed: By: Everlean Alstrom M.D. On: 01/31/2017 16:56   Mm Clip Placement Left  Result Date: 01/31/2017 CLINICAL DATA:  Post ultrasound-guided biopsy of an indeterminate mass in the left breast at the 10 o'clock position and ultrasound-guided biopsy of a lymph node with a mildly thickened cortex  in the left axilla. EXAM: DIAGNOSTIC LEFT MAMMOGRAM POST ULTRASOUND BIOPSY COMPARISON:  Previous exam(s). FINDINGS: Mammographic images were obtained following ultrasound guided biopsy of an indeterminate mass in the left breast at the 10 o'clock position and ultrasound-guided biopsy of a lymph node with a mildly thickened cortex in the left axilla. A ribbon shaped biopsy marking clip is present at the site of the biopsied mass in the left breast at the 10 o'clock position. A spiral shaped HydroMARK biopsy marking clip is present at the site of the biopsied lymph node in the left axilla, seen on the ML view only. IMPRESSION: 1. Ribbon shaped biopsy marking clip at site of biopsied mass in the left breast at the 10 o'clock position. 2. Spiral shaped HydroMARK biopsy marking clip at site  of biopsied lymph node in the left axilla. Final Assessment: Post Procedure Mammograms for Marker Placement Electronically Signed   By: Everlean Alstrom M.D.   On: 01/31/2017 16:58   [place] Lt Breast Bx W Loc Dev 1st Lesion Img Bx Spec US Guide  Addendum Date: 02/01/2017   ADDENDUM REPORT: 02/01/2017 15:23 ADDENDUM: Pathology revealed FIBROADENOMA of the Left breast, 10:00 o'clock. BENIGN LYMPH NODE WITH PIGMENT DEPOSITION of the Left axilla. This was found to be concordant by Dr. Everlean Alstrom. Pathology results were discussed with the patient by telephone. The patient reported doing well after the biopsies with tenderness at the sites. Post biopsy instructions and care were reviewed and questions were answered. The patient was encouraged to call [facility] for any additional concerns. The patient was instructed to return for annual screening mammography and informed a reminder notice would be sent regarding this appointment. Consideration of genetics testing/counseling was discussed with the patient and she was given information to contact [facility]. Pathology results reported by Terie Purser, RN on 02/01/2017. Electronically Signed   By: Everlean Alstrom M.D.   On: 02/01/2017 15:23   Result Date: 02/01/2017 CLINICAL DATA:  36 year old female with indeterminate mass in the left breast at the 10 o'clock position and an indeterminate left axillary lymph node. EXAM: ULTRASOUND GUIDED LEFT BREAST CORE NEEDLE BIOPSY COMPARISON:  Previous exam(s). FINDINGS: I met with the patient and we discussed the procedure of ultrasound-guided biopsy, including benefits and alternatives. We discussed the high likelihood of a successful procedure. We discussed the risks of the procedure, including infection, bleeding, tissue injury, clip migration, and inadequate sampling. Informed written consent was given. The usual time-out protocol was performed immediately prior to the procedure.  SITE 1: LEFT BREAST 10 O'CLOCK: Lesion quadrant: UPPER INNER Using sterile technique and 1% Lidocaine as local anesthetic, under direct ultrasound visualization, a 12 gauge spring-loaded device was used to perform biopsy of the mass in the left breast at the 10 o'clock position using a medial to lateral approach. At the conclusion of the procedure a ribbon shaped tissue marker clip was deployed into the biopsy cavity. SITE 2: LEFT AXILLA: Lesion quadrant: UPPER-OUTER Using sterile technique and 1% Lidocaine as local anesthetic, under direct ultrasound visualization, a 14 gauge spring-loaded device was used to perform biopsy of the lymph node with a mildly thickened cortex in the left axilla using an inferior to superior approach. At the conclusion of the procedure a spiral shaped HydroMARK tissue marker clip was deployed into the biopsy cavity. Follow up 2 view mammogram was performed and dictated separately. IMPRESSION: 1. Ultrasound-guided biopsy of the oval mass in the left breast at the 10 o'clock position.  2. Ultrasound-guided biopsy of the lymph node with mild cortical thickening in the left axilla. Electronically Signed: By: Everlean Alstrom M.D. On: 01/31/2017 16:56    Assessment & Plan:   Chrysta was seen today for toe injury.  Diagnoses and all orders for this visit:  Paronychia of great toe, left -     sulfamethoxazole-trimethoprim (BACTRIM DS,SEPTRA DS) 800-160 MG tablet; Take 1 tablet by mouth 2 (two) times daily. -     mupirocin ointment (BACTROBAN) 2 %; Apply 1 application topically 2 (two) times daily. Apply with dressing change  Need for influenza vaccination -     Flu Vaccine QUAD 6+ mos PF IM (Fluarix Quad PF)   I am having Ms. Holsapple start on sulfamethoxazole-trimethoprim and mupirocin ointment. I am also having her maintain her diphenhydrAMINE, Multiple Vitamins-Minerals (AIRBORNE PO), and norethindrone.  Meds ordered this encounter  Medications  .  sulfamethoxazole-trimethoprim (BACTRIM DS,SEPTRA DS) 800-160 MG tablet    Sig: Take 1 tablet by mouth 2 (two) times daily.    Dispense:  14 tablet    Refill:  0    Order Specific Question:   Supervising Provider    Answer:   Binnie Rail [[id number]]  . mupirocin ointment (BACTROBAN) 2 %    Sig: Apply 1 application topically 2 (two) times daily. Apply with dressing change    Dispense:  6 g    Refill:  0    Order Specific Question:   Supervising Provider    Answer:   Binnie Rail [[id number]]    Follow-up: No Follow-up on file.  Wilfred Lacy, NP

## 2017-09-13 ENCOUNTER — Ambulatory Visit (INDEPENDENT_AMBULATORY_CARE_PROVIDER_SITE_OTHER): Payer: BLUE CROSS/BLUE SHIELD | Admitting: Obstetrics and Gynecology

## 2017-09-13 ENCOUNTER — Other Ambulatory Visit: Payer: Self-pay

## 2017-09-13 ENCOUNTER — Encounter: Payer: Self-pay | Admitting: Obstetrics and Gynecology

## 2017-09-13 VITALS — BP 136/78 | HR 72 | Resp 14 | Wt 148.0 lb

## 2017-09-13 DIAGNOSIS — N760 Acute vaginitis: Secondary | ICD-10-CM | POA: Diagnosis not present

## 2017-09-13 DIAGNOSIS — L309 Dermatitis, unspecified: Secondary | ICD-10-CM

## 2017-09-13 MED ORDER — HYDROXYZINE HCL 25 MG PO TABS: 25.0000 mg | ORAL_TABLET | Freq: Three times a day (TID) | ORAL | 0 refills | Status: DC | PRN

## 2017-09-13 MED ORDER — BETAMETHASONE VALERATE 0.1 % EX OINT: 1.0000 "application " | TOPICAL_OINTMENT | Freq: Two times a day (BID) | CUTANEOUS | 0 refills | Status: DC

## 2017-09-13 NOTE — Patient Instructions (Signed)

## 2017-09-13 NOTE — Progress Notes (Signed)
GYNECOLOGY  VISIT   HPI: 37 y.o.   Married  Caucasian  female   G0P0 with Patient's last menstrual period was 08/28/2017.   here for vaginal and rectal irritation. Three days ago she started having perianal itching, 2 days ago she started having vulvo-vaginal itching. Tried monistat one, feeling worse. Trying ice packs and epsom salts.  She has some increased d/c, not sure if it is serous liquid from scratching. She is so uncomfortable she has been waking up at night.   GYNECOLOGIC HISTORY: Patient's last menstrual period was 08/28/2017. Contraception:OCP Menopausal hormone therapy: none        OB History    Gravida Para Term Preterm AB Living   0             SAB TAB Ectopic Multiple Live Births                     Patient Active Problem List   Diagnosis Date Noted  . Right knee pain 07/08/2016  . Rash and nonspecific skin eruption 06/03/2015  . Dysuria 04/02/2013  . Other abnormal glucose 04/02/2013  . Routine general medical examination at a health care facility 04/02/2013    Past Medical History:  Diagnosis Date  . Allergy   . Heart murmur    as a child  . Irregular menses   . Multiple thyroid nodules    Bilateral    Past Surgical History:  Procedure Laterality Date  . BREAST LUMPECTOMY Left 2001   benign    Current Outpatient Medications  Medication Sig Dispense Refill  . diphenhydrAMINE (BENADRYL) 25 MG tablet Take 25 mg by mouth every 6 (six) hours as needed. For allergies    . Multiple Vitamins-Minerals (AIRBORNE PO) Take by mouth daily.    . norethindrone (MICRONOR,CAMILA,ERRIN) 0.35 MG tablet Take 1 tablet (0.35 mg total) by mouth daily. 3 Package 3  . sulfamethoxazole-trimethoprim (BACTRIM DS,SEPTRA DS) 800-160 MG tablet Take 1 tablet by mouth 2 (two) times daily. 14 tablet 0   No current facility-administered medications for this visit.      ALLERGIES: Patient has no known allergies.  Family History  Problem Relation Age of Onset  . Hypertension  Father   . Cancer Father        throat and lung cancer  . Breast cancer Mother [id number]  . Diabetes Maternal Grandmother   . Breast cancer Maternal Grandmother 60  . Colon cancer Maternal Grandmother 90       Colon  . Heart disease Maternal Grandfather   . Colon cancer Paternal Grandmother [id number]       Colon  . Breast cancer Maternal Aunt   . Depression Neg Hx   . Stroke Neg Hx     Social History   Socioeconomic History  . Marital status: Married    Spouse name: Not on file  . Number of children: Not on file  . Years of education: Not on file  . Highest education level: Not on file  Social Needs  . Financial resource strain: Not on file  . Food insecurity - worry: Not on file  . Food insecurity - inability: Not on file  . Transportation needs - medical: Not on file  . Transportation needs - non-medical: Not on file  Occupational History  . Not on file  Tobacco Use  . Smoking status: Light Tobacco Smoker    Packs/day: 1.00    Last attempt to quit: 09/24/2010    Years since quitting: 6.9  .  Smokeless tobacco: Never Used  Substance and Sexual Activity  . Alcohol use: No    Alcohol/week: 0.0 oz  . Drug use: No  . Sexual activity: Yes    Partners: Male    Birth control/protection: Pill  Other Topics Concern  . Not on file  Social History Narrative  . Not on file    Review of Systems  Constitutional: Negative.   HENT: Negative.   Eyes: Negative.   Respiratory: Negative.   Cardiovascular: Negative.   Gastrointestinal: Negative.   Genitourinary:       Vulvar/vaginal itching Rectal itching Vaginal discharge   Musculoskeletal: Negative.   Skin: Negative.   Neurological: Negative.   Endo/Heme/Allergies: Negative.   Psychiatric/Behavioral: Negative.     PHYSICAL EXAMINATION:    BP 136/78 (BP Location: Right Arm, Patient Position: Sitting, Cuff Size: Normal)   Pulse 72   Resp 14   Wt 148 lb (67.1 kg)   LMP 08/28/2017   BMI 25.21 kg/m     General appearance:  alert, cooperative and appears stated age  Pelvic: External genitalia:  no lesions, mild erythema              Urethra:  normal appearing urethra with no masses, tenderness or lesions              Bartholins and Skenes: normal                 Vagina: normal appearing vagina with an increase in thick white vaginal d/c (used monistat 2 days ago)              Cervix: no lesions  Perianal area: excoriated, fissures, no lesions or plaques, swollen, erythematous  Chaperone was present for exam.  Wet prep: ? Clue, +artifact, no trich, + wbc KOH: no yeast PH: 5   ASSESSMENT Vulvovaginitis, slides not clear Severe perianal dermatitis    PLAN Affirm Will treat with steroid ointment and atarax Vulvar skin care information given and reviewed F/U if not improving in the next few days, discussed calling if she develops a fever or feels worse prior to that   An After Visit Summary was printed and given to the patient.  ~20 minutes face to face time of which over 50% was spent in counseling.

## 2017-09-14 LAB — VAGINITIS/VAGINOSIS, DNA PROBE
Candida Species: NEGATIVE
Gardnerella vaginalis: NEGATIVE
Trichomonas vaginosis: NEGATIVE

## 2017-10-17 IMAGING — DX DG KNEE COMPLETE 4+V*R*
4 series · 4 of 4 positions shown · non-contrast
Comparison: None.

CLINICAL DATA: Two weeks of right knee pain without known injury

EXAM:
RIGHT KNEE - COMPLETE 4+ VIEW

[knee ap]
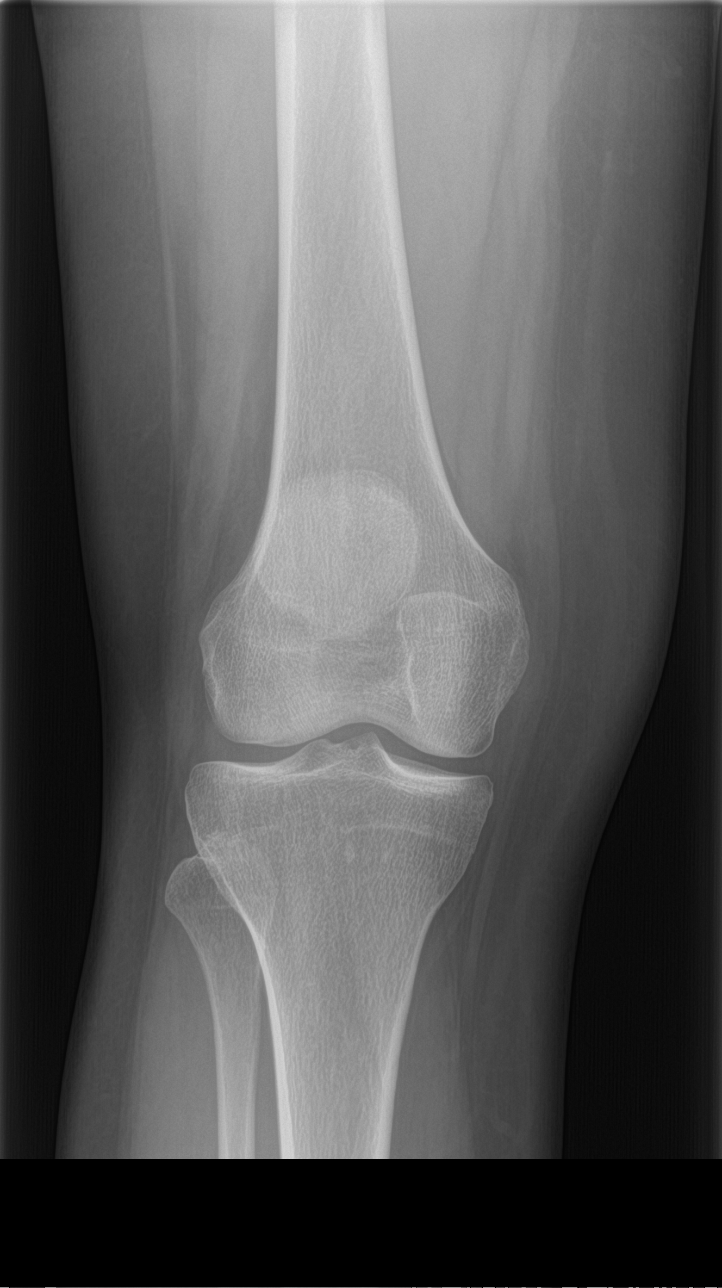

[knee tunnel]
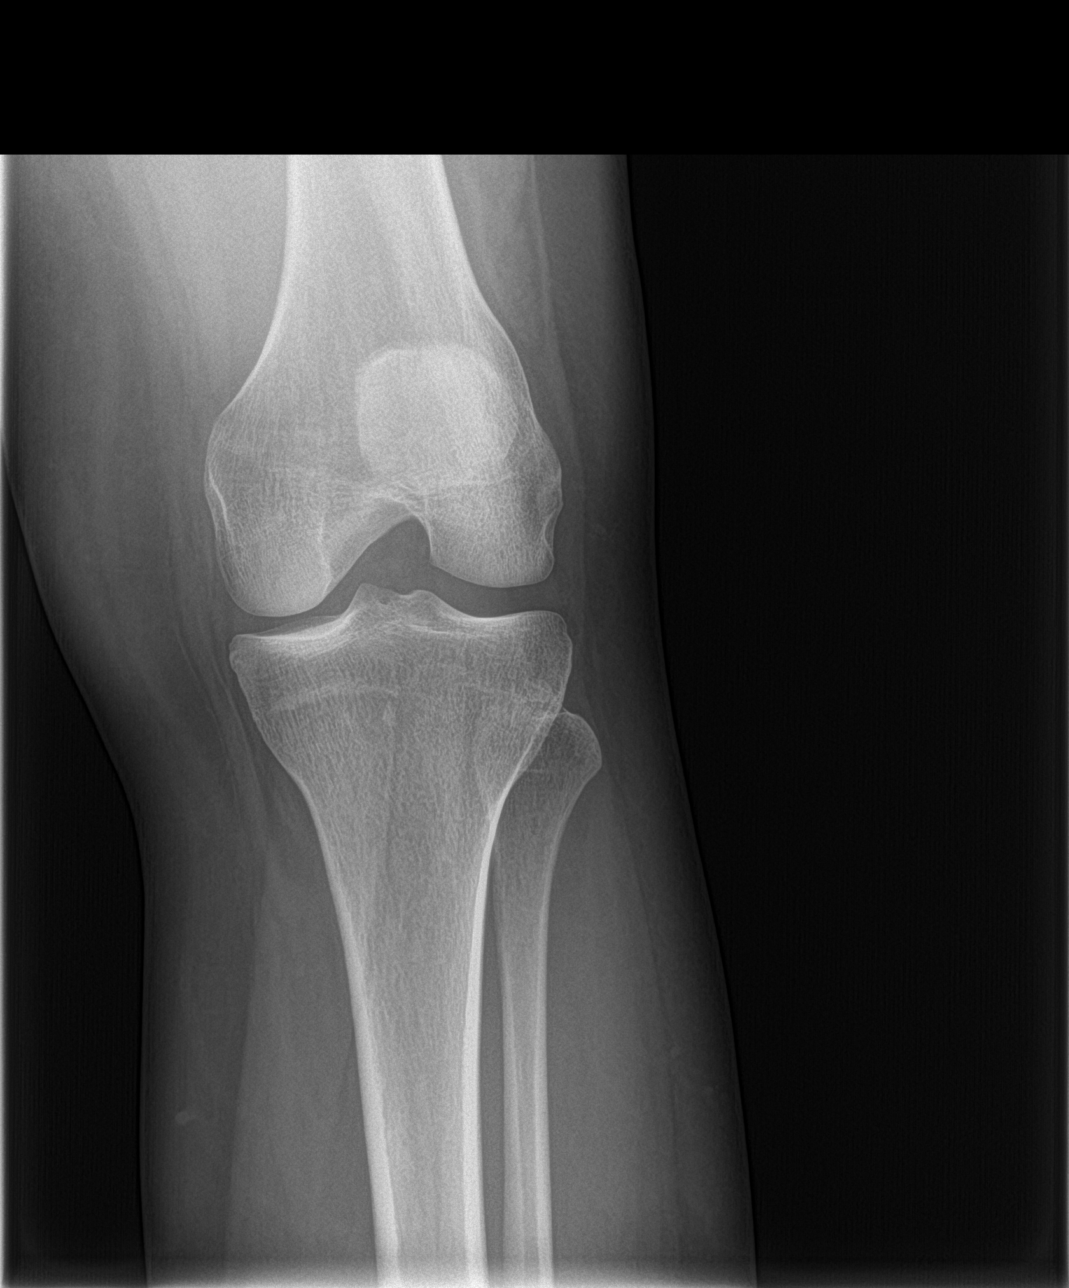

[knee lat]
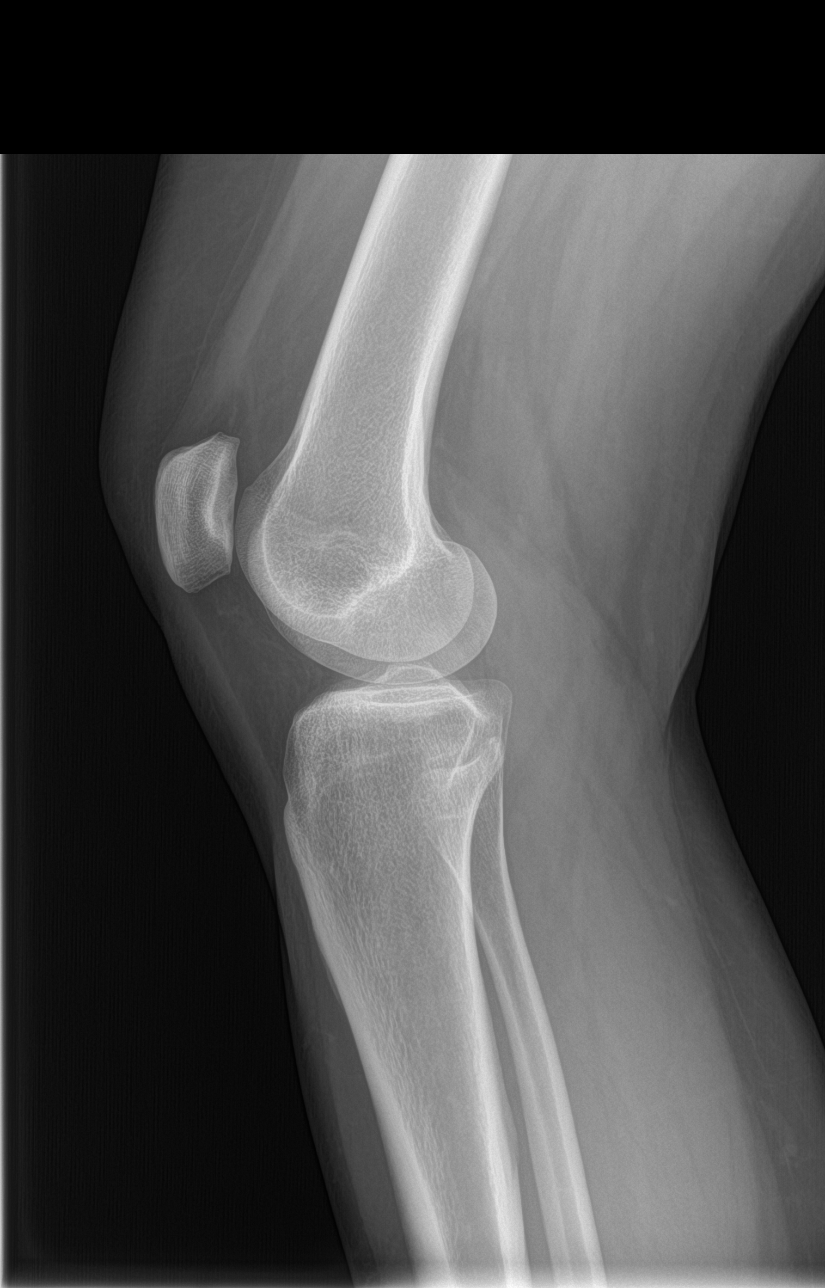

[sunrise]
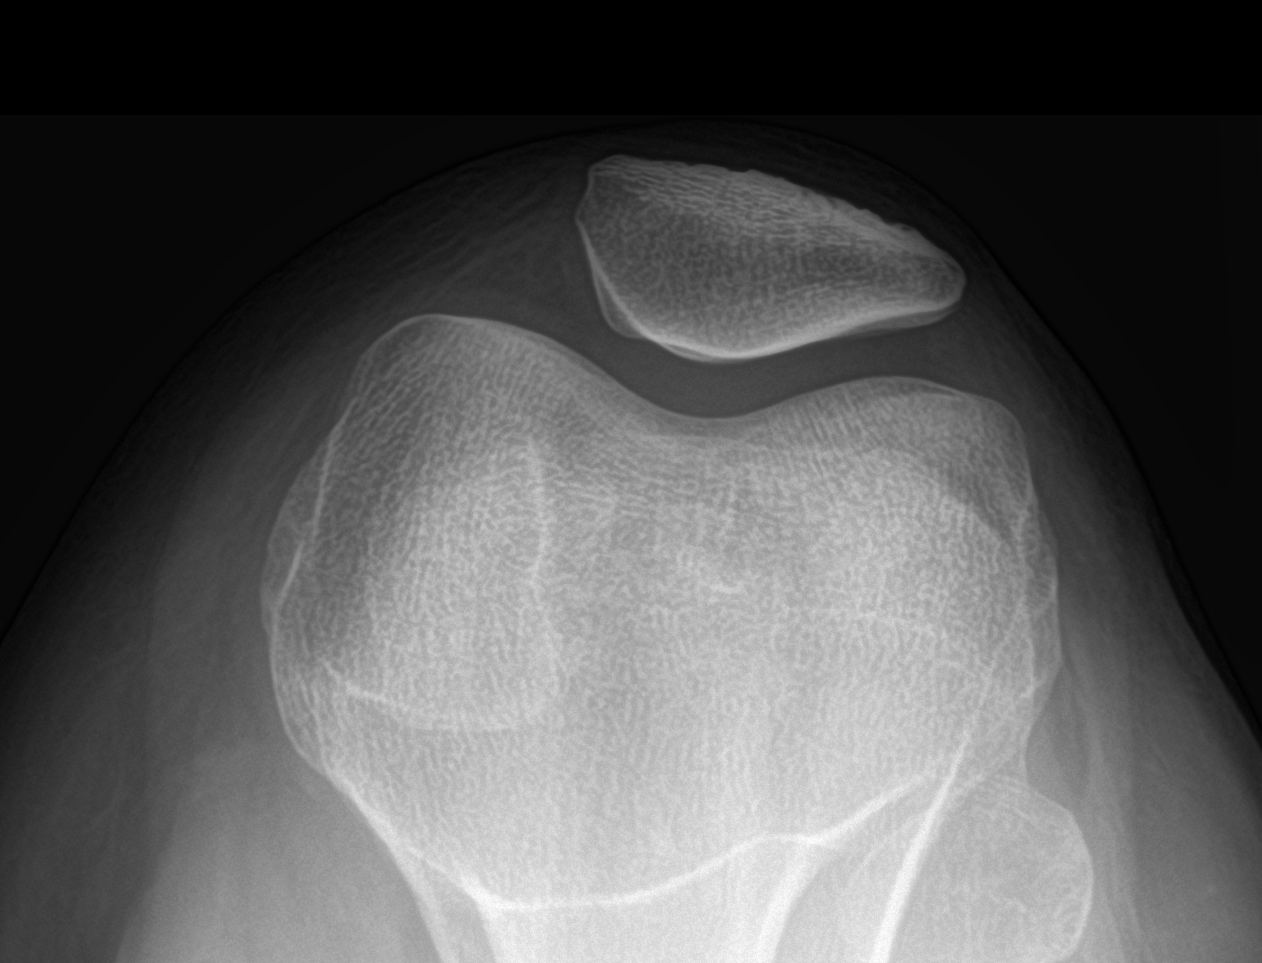

[4 of 4 positions shown; findings below may reference images not displayed]

FINDINGS: No evidence of fracture, dislocation, or joint effusion. No evidence
of arthropathy or other focal bone abnormality. Soft tissues are
unremarkable.
IMPRESSION: Negative.

## 2017-12-18 ENCOUNTER — Encounter: Payer: Self-pay | Admitting: Internal Medicine

## 2017-12-18 ENCOUNTER — Ambulatory Visit (INDEPENDENT_AMBULATORY_CARE_PROVIDER_SITE_OTHER): Payer: BLUE CROSS/BLUE SHIELD | Admitting: Internal Medicine

## 2017-12-18 ENCOUNTER — Other Ambulatory Visit (INDEPENDENT_AMBULATORY_CARE_PROVIDER_SITE_OTHER): Payer: BLUE CROSS/BLUE SHIELD

## 2017-12-18 VITALS — BP 122/82 | HR 80 | Temp 99.0°F | Resp 16 | Wt 148.0 lb

## 2017-12-18 DIAGNOSIS — I889 Nonspecific lymphadenitis, unspecified: Secondary | ICD-10-CM

## 2017-12-18 LAB — CBC WITH DIFFERENTIAL/PLATELET
Basophils Absolute: 0.1 10*3/uL (ref 0.0–0.1)
Basophils Relative: 0.7 % (ref 0.0–3.0)
EOS PCT: 0.8 % (ref 0.0–5.0)
Eosinophils Absolute: 0.1 10*3/uL (ref 0.0–0.7)
HCT: 38.7 % (ref 36.0–46.0)
HEMOGLOBIN: 12.8 g/dL (ref 12.0–15.0)
LYMPHS ABS: 3.3 10*3/uL (ref 0.7–4.0)
Lymphocytes Relative: 23.7 % (ref 12.0–46.0)
MCHC: 33 g/dL (ref 30.0–36.0)
MCV: 89.9 fl (ref 78.0–100.0)
MONO ABS: 0.7 10*3/uL (ref 0.1–1.0)
MONOS PCT: 5.4 % (ref 3.0–12.0)
NEUTROS PCT: 69.4 % (ref 43.0–77.0)
Neutro Abs: 9.7 10*3/uL — ABNORMAL HIGH (ref 1.4–7.7)
Platelets: 281 10*3/uL (ref 150.0–400.0)
RBC: 4.3 Mil/uL (ref 3.87–5.11)
RDW: 13.5 % (ref 11.5–15.5)
WBC: 14 10*3/uL — ABNORMAL HIGH (ref 4.0–10.5)

## 2017-12-18 LAB — COMPREHENSIVE METABOLIC PANEL
ALBUMIN: 4.2 g/dL (ref 3.5–5.2)
ALK PHOS: 88 U/L (ref 39–117)
ALT: 11 U/L (ref 0–35)
AST: 13 U/L (ref 0–37)
BILIRUBIN TOTAL: 0.2 mg/dL (ref 0.2–1.2)
BUN: 6 mg/dL (ref 6–23)
CO2: 29 mEq/L (ref 19–32)
Calcium: 9.3 mg/dL (ref 8.4–10.5)
Chloride: 105 mEq/L (ref 96–112)
Creatinine, Ser: 0.55 mg/dL (ref 0.40–1.20)
GFR: 132.7 mL/min (ref >60.00)
GLUCOSE: 111 mg/dL — AB (ref 70–99)
POTASSIUM: 4.1 meq/L (ref 3.5–5.1)
Sodium: 141 mEq/L (ref 135–145)
TOTAL PROTEIN: 7.7 g/dL (ref 6.0–8.3)

## 2017-12-18 LAB — SEDIMENTATION RATE: Sed Rate: 11 mm/hr (ref 0–20)

## 2017-12-18 MED ORDER — CEFDINIR 300 MG PO CAPS: 300.0000 mg | ORAL_CAPSULE | Freq: Two times a day (BID) | ORAL | 0 refills | Status: AC

## 2017-12-18 NOTE — Patient Instructions (Signed)
Lymphadenopathy Lymphadenopathy refers to swollen or enlarged lymph glands, also called lymph nodes. Lymph glands are part of your body's defense (immune) system, which protects the body from infections, germs, and diseases. Lymph glands are found in many locations in your body, including the neck, underarm, and groin. Many things can cause lymph glands to become enlarged. When your immune system responds to germs, such as viruses or bacteria, infection-fighting cells and fluid build up. This causes the glands to grow in size. Usually, this is not something to worry about. The swelling and any soreness often go away without treatment. However, swollen lymph glands can also be caused by a number of diseases. Your health care provider may do various tests to help determine the cause. If the cause of your swollen lymph glands cannot be found, it is important to monitor your condition to make sure the swelling goes away. Follow these instructions at home: Watch your condition for any changes. The following actions may help to lessen any discomfort you are feeling:  Get plenty of rest.  Take medicines only as directed by your health care provider. Your health care provider may recommend over-the-counter medicines for pain.  Apply moist heat compresses to the site of swollen lymph nodes as directed by your health care provider. This can help reduce any pain.  Check your lymph nodes daily for any changes.  Keep all follow-up visits as directed by your health care provider. This is important.  Contact a health care provider if:  Your lymph nodes are still swollen after 2 weeks.  Your swelling increases or spreads to other areas.  Your lymph nodes are hard, seem fixed to the skin, or are growing rapidly.  Your skin over the lymph nodes is red and inflamed.  You have a fever.  You have chills.  You have fatigue.  You develop a sore throat.  You have abdominal pain.  You have weight  loss.  You have night sweats. Get help right away if:  You notice fluid leaking from the area of the enlarged lymph node.  You have severe pain in any area of your body.  You have chest pain.  You have shortness of breath. This information is not intended to replace advice given to you by your health care provider. Make sure you discuss any questions you have with your health care provider. Document Released: 06/07/2008 Document Revised: 02/04/2016 Document Reviewed: 04/03/2014 Elsevier Interactive Patient Education  2018 [employer]  

## 2017-12-19 ENCOUNTER — Encounter: Payer: Self-pay | Admitting: Internal Medicine

## 2017-12-19 LAB — HIV ANTIBODY (ROUTINE TESTING W REFLEX): HIV 1&2 Ab, 4th Generation: NONREACTIVE

## 2017-12-19 NOTE — Progress Notes (Signed)
Subjective:  Patient ID: Toni Wang, female    DOB: [birth year]  Age: 37 y.o. MRN: [id number]  CC: Lymphadenopathy  NEW TO ME  HPI [name] presents for a 4-day history of painful lymph node on the left lower side of her neck.  She tells me she quit smoking about a month ago.  She denies sore throat, fever, chills, rash, or night sweats.  Outpatient Medications Prior to Visit  Medication Sig Dispense Refill  . diphenhydrAMINE (BENADRYL) 25 MG tablet Take 25 mg by mouth every 6 (six) hours as needed. For allergies    . Multiple Vitamins-Minerals (AIRBORNE PO) Take by mouth daily.    . norethindrone (MICRONOR,CAMILA,ERRIN) 0.35 MG tablet Take 1 tablet (0.35 mg total) by mouth daily. 3 Package 3  . betamethasone valerate ointment (VALISONE) 0.1 % Apply 1 application topically 2 (two) times daily. May use for up to 2 weeks. Not for daily long term use (Patient not taking: Reported on 12/18/2017) 30 g 0  . hydrOXYzine (ATARAX/VISTARIL) 25 MG tablet Take 1 tablet (25 mg total) by mouth 3 (three) times daily as needed. (Patient not taking: Reported on 12/18/2017) 20 tablet 0   No facility-administered medications prior to visit.     ROS Review of Systems  Constitutional: Negative for chills, fatigue and fever.  HENT: Negative.  Negative for sinus pressure, sore throat, trouble swallowing and voice change.   Eyes: Negative.   Respiratory: Negative.  Negative for cough, chest tightness, shortness of breath and wheezing.   Cardiovascular: Negative for chest pain, palpitations and leg swelling.  Gastrointestinal: Negative for abdominal pain, constipation, diarrhea, nausea and vomiting.  Endocrine: Negative.   Genitourinary: Negative.  Negative for difficulty urinating.  Musculoskeletal: Negative for arthralgias and myalgias.  Skin: Negative for color change, pallor and rash.  Neurological: Negative.  Negative for dizziness, weakness, light-headedness and headaches.  Hematological:  Positive for adenopathy. Does not bruise/bleed easily.  Psychiatric/Behavioral: Negative.     Objective:  BP 122/82   Pulse 80   Temp 99 F (37.2 C)   Resp 16   Wt 148 lb (67.1 kg)   SpO2 99%   BMI 25.21 kg/m   BP Readings from Last 3 Encounters:  12/18/17 122/82  09/13/17 136/78  06/29/17 124/78    Wt Readings from Last 3 Encounters:  12/18/17 148 lb (67.1 kg)  09/13/17 148 lb (67.1 kg)  06/29/17 149 lb (67.6 kg)    Physical Exam  Constitutional: No distress.  HENT:  Nose: Nose normal.  Mouth/Throat: Oropharynx is clear and moist. No oropharyngeal exudate.  Neck: Normal range of motion. Neck supple. No JVD present. No thyromegaly present.    Cardiovascular: Normal rate, regular rhythm, normal heart sounds and intact distal pulses. Exam reveals no gallop and no friction rub.  No murmur heard. Pulmonary/Chest: Effort normal. No stridor. No respiratory distress. She has no wheezes. She has no rales.  Abdominal: Soft. Bowel sounds are normal. She exhibits no distension and no mass. There is no splenomegaly or hepatomegaly. There is no tenderness. There is no rebound. No hernia.  Musculoskeletal: She exhibits no edema or deformity.  Lymphadenopathy:    She has no cervical adenopathy.  Neurological: She is alert.  Skin: Skin is warm and dry. No rash noted. She is not diaphoretic. No pallor.    Lab Results  Component Value Date   WBC 14.0 (H) 12/18/2017   HGB 12.8 12/18/2017   HCT 38.7 12/18/2017   PLT 281.0 12/18/2017   GLUCOSE  111 (H) 12/18/2017   CHOL 234 (H) 01/24/2017   TRIG 181 (H) 01/24/2017   HDL 40 (L) 01/24/2017   LDLDIRECT 131.6 04/02/2013   LDLCALC 158 (H) 01/24/2017   ALT 11 12/18/2017   AST 13 12/18/2017   NA 141 12/18/2017   K 4.1 12/18/2017   CL 105 12/18/2017   CREATININE 0.55 12/18/2017   BUN 6 12/18/2017   CO2 29 12/18/2017   TSH 1.00 01/24/2017   HGBA1C 5.3 04/02/2013    [place] Axillary Node Core Biopsy Left  Addendum Date: 02/01/2017     ADDENDUM REPORT: 02/01/2017 15:23 ADDENDUM: Pathology revealed FIBROADENOMA of the Left breast, 10:00 o'clock. BENIGN LYMPH NODE WITH PIGMENT DEPOSITION of the Left axilla. This was found to be concordant by Dr. Everlean Alstrom. Pathology results were discussed with the patient by telephone. The patient reported doing well after the biopsies with tenderness at the sites. Post biopsy instructions and care were reviewed and questions were answered. The patient was encouraged to call [facility] for any additional concerns. The patient was instructed to return for annual screening mammography and informed a reminder notice would be sent regarding this appointment. Consideration of genetics testing/counseling was discussed with the patient and she was given information to contact [facility]. Pathology results reported by Terie Purser, RN on 02/01/2017. Electronically Signed   By: Everlean Alstrom M.D.   On: 02/01/2017 15:23   Result Date: 02/01/2017 CLINICAL DATA:  37 year old female with indeterminate mass in the left breast at the 10 o'clock position and an indeterminate left axillary lymph node. EXAM: ULTRASOUND GUIDED LEFT BREAST CORE NEEDLE BIOPSY COMPARISON:  Previous exam(s). FINDINGS: I met with the patient and we discussed the procedure of ultrasound-guided biopsy, including benefits and alternatives. We discussed the high likelihood of a successful procedure. We discussed the risks of the procedure, including infection, bleeding, tissue injury, clip migration, and inadequate sampling. Informed written consent was given. The usual time-out protocol was performed immediately prior to the procedure. SITE 1: LEFT BREAST 10 O'CLOCK: Lesion quadrant: UPPER INNER Using sterile technique and 1% Lidocaine as local anesthetic, under direct ultrasound visualization, a 12 gauge spring-loaded device was used to perform biopsy of the mass in the left breast at the 10 o'clock  position using a medial to lateral approach. At the conclusion of the procedure a ribbon shaped tissue marker clip was deployed into the biopsy cavity. SITE 2: LEFT AXILLA: Lesion quadrant: UPPER-OUTER Using sterile technique and 1% Lidocaine as local anesthetic, under direct ultrasound visualization, a 14 gauge spring-loaded device was used to perform biopsy of the lymph node with a mildly thickened cortex in the left axilla using an inferior to superior approach. At the conclusion of the procedure a spiral shaped HydroMARK tissue marker clip was deployed into the biopsy cavity. Follow up 2 view mammogram was performed and dictated separately. IMPRESSION: 1. Ultrasound-guided biopsy of the oval mass in the left breast at the 10 o'clock position. 2. Ultrasound-guided biopsy of the lymph node with mild cortical thickening in the left axilla. Electronically Signed: By: Everlean Alstrom M.D. On: 01/31/2017 16:56   Mm Clip Placement Left  Result Date: 01/31/2017 CLINICAL DATA:  Post ultrasound-guided biopsy of an indeterminate mass in the left breast at the 10 o'clock position and ultrasound-guided biopsy of a lymph node with a mildly thickened cortex in the left axilla. EXAM: DIAGNOSTIC LEFT MAMMOGRAM POST ULTRASOUND BIOPSY COMPARISON:  Previous exam(s). FINDINGS: Mammographic images were obtained following ultrasound  guided biopsy of an indeterminate mass in the left breast at the 10 o'clock position and ultrasound-guided biopsy of a lymph node with a mildly thickened cortex in the left axilla. A ribbon shaped biopsy marking clip is present at the site of the biopsied mass in the left breast at the 10 o'clock position. A spiral shaped HydroMARK biopsy marking clip is present at the site of the biopsied lymph node in the left axilla, seen on the ML view only. IMPRESSION: 1. Ribbon shaped biopsy marking clip at site of biopsied mass in the left breast at the 10 o'clock position. 2. Spiral shaped HydroMARK biopsy  marking clip at site of biopsied lymph node in the left axilla. Final Assessment: Post Procedure Mammograms for Marker Placement Electronically Signed   By: Everlean Alstrom M.D.   On: 01/31/2017 16:58   [place] Lt Breast Bx W Loc Dev 1st Lesion Img Bx Spec US Guide  Addendum Date: 02/01/2017   ADDENDUM REPORT: 02/01/2017 15:23 ADDENDUM: Pathology revealed FIBROADENOMA of the Left breast, 10:00 o'clock. BENIGN LYMPH NODE WITH PIGMENT DEPOSITION of the Left axilla. This was found to be concordant by Dr. Everlean Alstrom. Pathology results were discussed with the patient by telephone. The patient reported doing well after the biopsies with tenderness at the sites. Post biopsy instructions and care were reviewed and questions were answered. The patient was encouraged to call [facility] for any additional concerns. The patient was instructed to return for annual screening mammography and informed a reminder notice would be sent regarding this appointment. Consideration of genetics testing/counseling was discussed with the patient and she was given information to contact [facility]. Pathology results reported by Terie Purser, RN on 02/01/2017. Electronically Signed   By: Everlean Alstrom M.D.   On: 02/01/2017 15:23   Result Date: 02/01/2017 CLINICAL DATA:  37 year old female with indeterminate mass in the left breast at the 10 o'clock position and an indeterminate left axillary lymph node. EXAM: ULTRASOUND GUIDED LEFT BREAST CORE NEEDLE BIOPSY COMPARISON:  Previous exam(s). FINDINGS: I met with the patient and we discussed the procedure of ultrasound-guided biopsy, including benefits and alternatives. We discussed the high likelihood of a successful procedure. We discussed the risks of the procedure, including infection, bleeding, tissue injury, clip migration, and inadequate sampling. Informed written consent was given. The usual time-out protocol was performed immediately  prior to the procedure. SITE 1: LEFT BREAST 10 O'CLOCK: Lesion quadrant: UPPER INNER Using sterile technique and 1% Lidocaine as local anesthetic, under direct ultrasound visualization, a 12 gauge spring-loaded device was used to perform biopsy of the mass in the left breast at the 10 o'clock position using a medial to lateral approach. At the conclusion of the procedure a ribbon shaped tissue marker clip was deployed into the biopsy cavity. SITE 2: LEFT AXILLA: Lesion quadrant: UPPER-OUTER Using sterile technique and 1% Lidocaine as local anesthetic, under direct ultrasound visualization, a 14 gauge spring-loaded device was used to perform biopsy of the lymph node with a mildly thickened cortex in the left axilla using an inferior to superior approach. At the conclusion of the procedure a spiral shaped HydroMARK tissue marker clip was deployed into the biopsy cavity. Follow up 2 view mammogram was performed and dictated separately. IMPRESSION: 1. Ultrasound-guided biopsy of the oval mass in the left breast at the 10 o'clock position. 2. Ultrasound-guided biopsy of the lymph node with mild cortical thickening in the left axilla. Electronically Signed: By: Lily Lovings.D.  On: 01/31/2017 16:56    Assessment & Plan:   Nhi was seen today for lymphadenopathy.  Diagnoses and all orders for this visit:  Cervical lymphadenitis- This may be a viral lymphadenitis but her her white cell count is mildly elevated and her sed rate is normal.  I will empirically treat for bacterial causes with a course of a cephalosporin antibiotic.  Will also screen her for HIV.  I have asked to return in about 2-3 weeks and if the lymphadenopathy is not resolving then will consider doing a CT scan of the area to see if there is concern for a malignant process. -     CBC with Differential/Platelet; Future -     Comprehensive metabolic panel; Future -     Sedimentation rate; Future -     HIV antibody; Future -      cefdinir (OMNICEF) 300 MG capsule; Take 1 capsule (300 mg total) by mouth 2 (two) times daily for 10 days.   I am having Kearney Hard start on cefdinir. I am also having her maintain her diphenhydrAMINE, Multiple Vitamins-Minerals (AIRBORNE PO), norethindrone, betamethasone valerate ointment, and hydrOXYzine.  Meds ordered this encounter  Medications  . cefdinir (OMNICEF) 300 MG capsule    Sig: Take 1 capsule (300 mg total) by mouth 2 (two) times daily for 10 days.    Dispense:  20 capsule    Refill:  0     Follow-up: Return in about 3 weeks (around 01/08/2018).  Scarlette Calico, MD

## 2018-01-03 ENCOUNTER — Other Ambulatory Visit: Payer: Self-pay | Admitting: Obstetrics and Gynecology

## 2018-01-03 NOTE — Telephone Encounter (Signed)
Medication refill request: OCP Last AEX:  01-24-17  Next AEX: 02-07-18 Last MMG (if hormonal medication request): N/A Refill authorized: please advise

## 2018-01-26 ENCOUNTER — Other Ambulatory Visit: Payer: Self-pay | Admitting: Obstetrics and Gynecology

## 2018-01-26 NOTE — Telephone Encounter (Signed)
Medication refill request: Suanne Marker Last AEX:  01-24-17 Next AEX: 02-07-18 Last MMG (if hormonal medication request): breast biopsy done 5/18 Refill authorized: pt needs 1 pack to get her to aex. Please approve if appropriate.

## 2018-02-07 ENCOUNTER — Other Ambulatory Visit: Payer: Self-pay

## 2018-02-07 ENCOUNTER — Other Ambulatory Visit (HOSPITAL_COMMUNITY)
Admission: RE | Admit: 2018-02-07 | Discharge: 2018-02-07 | Disposition: A | Discharge status: Home / Self Care | Payer: BLUE CROSS/BLUE SHIELD | Source: Ambulatory Visit | Attending: Obstetrics and Gynecology | Admitting: Obstetrics and Gynecology

## 2018-02-07 ENCOUNTER — Ambulatory Visit (INDEPENDENT_AMBULATORY_CARE_PROVIDER_SITE_OTHER): Payer: BLUE CROSS/BLUE SHIELD | Admitting: Obstetrics and Gynecology

## 2018-02-07 ENCOUNTER — Encounter: Payer: Self-pay | Admitting: Obstetrics and Gynecology

## 2018-02-07 VITALS — BP 128/80 | HR 84 | Resp 16 | Ht 64.5 in | Wt 149.0 lb

## 2018-02-07 DIAGNOSIS — Z124 Encounter for screening for malignant neoplasm of cervix: Secondary | ICD-10-CM | POA: Insufficient documentation

## 2018-02-07 DIAGNOSIS — Z01419 Encounter for gynecological examination (general) (routine) without abnormal findings: Secondary | ICD-10-CM | POA: Diagnosis not present

## 2018-02-07 DIAGNOSIS — Z1151 Encounter for screening for human papillomavirus (HPV): Secondary | ICD-10-CM | POA: Insufficient documentation

## 2018-02-07 DIAGNOSIS — Z803 Family history of malignant neoplasm of breast: Secondary | ICD-10-CM

## 2018-02-07 DIAGNOSIS — Z3041 Encounter for surveillance of contraceptive pills: Secondary | ICD-10-CM

## 2018-02-07 LAB — HM PAP SMEAR

## 2018-02-07 MED ORDER — NORETHINDRONE 0.35 MG PO TABS: ORAL_TABLET | ORAL | 3 refills | Status: DC

## 2018-02-07 NOTE — Patient Instructions (Signed)
EXERCISE AND DIET:  We recommended that you start or continue a regular exercise program for good health. Regular exercise means any activity that makes your heart beat faster and makes you sweat.  We recommend exercising at least 30 minutes per day at least 3 days a week, preferably 4 or 5.  We also recommend a diet low in fat and sugar.  Inactivity, poor dietary choices and obesity can cause diabetes, heart attack, stroke, and kidney damage, among others.    ALCOHOL AND SMOKING:  Women should limit their alcohol intake to no more than 7 drinks/beers/glasses of wine (combined, not each!) per week. Moderation of alcohol intake to this level decreases your risk of breast cancer and liver damage. And of course, no recreational drugs are part of a healthy lifestyle.  And absolutely no smoking or even second hand smoke. Most people know smoking can cause heart and lung diseases, but did you know it also contributes to weakening of your bones? Aging of your skin?  Yellowing of your teeth and nails?  CALCIUM AND VITAMIN D:  Adequate intake of calcium and Vitamin D are recommended.  The recommendations for exact amounts of these supplements seem to change often, but generally speaking 600 mg of calcium (either carbonate or citrate) and 800 units of Vitamin D per day seems prudent. Certain women may benefit from higher intake of Vitamin D.  If you are among these women, your doctor will have told you during your visit.    PAP SMEARS:  Pap smears, to check for cervical cancer or precancers,  have traditionally been done yearly, although recent scientific advances have shown that most women can have pap smears less often.  However, every woman still should have a physical exam from her gynecologist every year. It will include a breast check, inspection of the vulva and vagina to check for abnormal growths or skin changes, a visual exam of the cervix, and then an exam to evaluate the size and shape of the uterus and  ovaries.  And after 37 years of age, a rectal exam is indicated to check for rectal cancers. We will also provide age appropriate advice regarding health maintenance, like when you should have certain vaccines, screening for sexually transmitted diseases, bone density testing, colonoscopy, mammograms, etc.   MAMMOGRAMS:  All women over 40 years old should have a yearly mammogram. Many facilities now offer a "3D" mammogram, which may cost around $50 extra out of pocket. If possible,  we recommend you accept the option to have the 3D mammogram performed.  It both reduces the number of women who will be called back for extra views which then turn out to be normal, and it is better than the routine mammogram at detecting truly abnormal areas.    COLONOSCOPY:  Colonoscopy to screen for colon cancer is recommended for all women at age 50.  We know, you hate the idea of the prep.  We agree, BUT, having colon cancer and not knowing it is worse!!  Colon cancer so often starts as a polyp that can be seen and removed at colonscopy, which can quite literally save your life!  And if your first colonoscopy is normal and you have no family history of colon cancer, most women don't have to have it again for 10 years.  Once every ten years, you can do something that may end up saving your life, right?  We will be happy to help you get it scheduled when you are ready.    Be sure to check your insurance coverage so you understand how much it will cost.  It may be covered as a preventative service at no cost, but you should check your particular policy.      Breast Self-Awareness Breast self-awareness means being familiar with how your breasts look and feel. It involves checking your breasts regularly and reporting any changes to your health care provider. Practicing breast self-awareness is important. A change in your breasts can be a sign of a serious medical problem. Being familiar with how your breasts look and feel allows  you to find any problems early, when treatment is more likely to be successful. All women should practice breast self-awareness, including women who have had breast implants. How to do a breast self-exam One way to learn what is normal for your breasts and whether your breasts are changing is to do a breast self-exam. To do a breast self-exam: Look for Changes  1. Remove all the clothing above your waist. 2. Stand in front of a mirror in a room with good lighting. 3. Put your hands on your hips. 4. Push your hands firmly downward. 5. Compare your breasts in the mirror. Look for differences between them (asymmetry), such as: ? Differences in shape. ? Differences in size. ? Puckers, dips, and bumps in one breast and not the other. 6. Look at each breast for changes in your skin, such as: ? Redness. ? Scaly areas. 7. Look for changes in your nipples, such as: ? Discharge. ? Bleeding. ? Dimpling. ? Redness. ? A change in position. Feel for Changes  Carefully feel your breasts for lumps and changes. It is best to do this while lying on your back on the floor and again while sitting or standing in the shower or tub with soapy water on your skin. Feel each breast in the following way:  Place the arm on the side of the breast you are examining above your head.  Feel your breast with the other hand.  Start in the nipple area and make  inch (2 cm) overlapping circles to feel your breast. Use the pads of your three middle fingers to do this. Apply light pressure, then medium pressure, then firm pressure. The light pressure will allow you to feel the tissue closest to the skin. The medium pressure will allow you to feel the tissue that is a little deeper. The firm pressure will allow you to feel the tissue close to the ribs.  Continue the overlapping circles, moving downward over the breast until you feel your ribs below your breast.  Move one finger-width toward the center of the body.  Continue to use the  inch (2 cm) overlapping circles to feel your breast as you move slowly up toward your collarbone.  Continue the up and down exam using all three pressures until you reach your armpit.  Write Down What You Find  Write down what is normal for each breast and any changes that you find. Keep a written record with breast changes or normal findings for each breast. By writing this information down, you do not need to depend only on memory for size, tenderness, or location. Write down where you are in your menstrual cycle, if you are still menstruating. If you are having trouble noticing differences in your breasts, do not get discouraged. With time you will become more familiar with the variations in your breasts and more comfortable with the exam. How often should I examine my breasts? Examine   your breasts every month. If you are breastfeeding, the best time to examine your breasts is after a feeding or after using a breast pump. If you menstruate, the best time to examine your breasts is 5-7 days after your period is over. During your period, your breasts are lumpier, and it may be more difficult to notice changes. When should I see my health care provider? See your health care provider if you notice:  A change in shape or size of your breasts or nipples.  A change in the skin of your breast or nipples, such as a reddened or scaly area.  Unusual discharge from your nipples.  A lump or thick area that was not there before.  Pain in your breasts.  Anything that concerns you.  This information is not intended to replace advice given to you by your health care provider. Make sure you discuss any questions you have with your health care provider. Document Released: 08/29/2005 Document Revised: 02/04/2016 Document Reviewed: 07/19/2015 [employer]vier Interactive Patient Education  2018 [employer]  

## 2018-02-07 NOTE — Progress Notes (Signed)
37 y.o. G0P0 MarriedCaucasianF here for annual exam.  She is on the mini-pill. Cycles vary from 31-35 days.  Period Cycle (Days): 31 Period Duration (Days): 4-5 days  Period Pattern: (!) Irregular Menstrual Flow: Moderate Menstrual Control: Thin pad, Tampon Menstrual Control Change Freq (Hours): changes tampon 2-3 times a day  Dysmenorrhea: None to moderate cramps. Helped with advil. Not sexually active in the last year.   Patient's last menstrual period was 01/30/2018.          Sexually active: No.  The current method of family planning is OCP (estrogen/progesterone).    Exercising: Yes.    walking Smoker:  Former smoker, quit in the last few months.    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    reports that she quit smoking about 2 months ago. She smoked 1.00 pack per day. She has never used smokeless tobacco. She reports that she has current or past drug history. Drug: Marijuana. She reports that she does not drink alcohol. Works as a Librarian, academic.   Past Medical History:  Diagnosis Date  . Allergy   . Heart murmur    as a child  . Irregular menses   . Multiple thyroid nodules    Bilateral    Past Surgical History:  Procedure Laterality Date  . BREAST LUMPECTOMY Left 2001   benign    Current Outpatient Medications  Medication Sig Dispense Refill  . diphenhydrAMINE (BENADRYL) 25 MG tablet Take 25 mg by mouth every 6 (six) hours as needed. For allergies    . Multiple Vitamins-Minerals (AIRBORNE PO) Take by mouth daily.    . NORLYDA 0.35 MG tablet TAKE 1 TABLET(0.35 MG) BY MOUTH DAILY 28 tablet 0   No current facility-administered medications for this visit.     Family History  Problem Relation Age of Onset  . Hypertension Father    . Cancer Father        throat and lung cancer  . Breast cancer Mother 77  . Diabetes Maternal Grandmother   . Breast cancer Maternal Grandmother 60  . Colon cancer Maternal Grandmother 90       Colon  . Heart disease Maternal Grandfather   . Colon cancer Paternal Grandmother 30       Colon  . Breast cancer Maternal Aunt   . Depression Neg Hx   . Stroke Neg Hx     Review of Systems  Constitutional: Negative.   HENT: Negative.   Eyes: Negative.   Respiratory: Negative.   Cardiovascular: Negative.   Gastrointestinal: Negative.   Endocrine: Negative.   Genitourinary: Negative.        Irregular menstrual cycles   Musculoskeletal: Negative.   Skin: Negative.   Allergic/Immunologic: Negative.   Neurological: Negative.   Psychiatric/Behavioral: Negative.     Exam:   BP 128/80 (BP Location: Right Arm, Patient Position: Sitting, Cuff Size: Normal)   Pulse 84   Resp 16   Ht 5' 4.5" (1.638 m)   Wt 149 lb (67.6 kg)   LMP 01/30/2018   BMI 25.18 kg/m   Weight change: @ Height:   Height: 5' 4.5" (163.8 cm)  Ht Readings from Last 3 Encounters:  02/07/18 5' 4.5" (1.638 m)  06/29/17 5' 4.25" (1.632 m)  01/24/17 5' 4.25" (1.632 m)    General appearance: alert, cooperative and appears stated age Head: Normocephalic, without obvious abnormality, atraumatic Neck: no adenopathy, supple, symmetrical, trachea midline and thyroid normal to inspection and palpation Lungs: clear to auscultation bilaterally Cardiovascular: regular rate and rhythm Breasts: normal appearance, no masses or tenderness Abdomen: soft, non-tender; non distended,  no masses,  no organomegaly Extremities: extremities normal, atraumatic, no cyanosis or edema Skin: Skin color, texture, turgor normal. No rashes or lesions Lymph nodes: Cervical, supraclavicular, and axillary nodes normal. No abnormal inguinal nodes palpated Neurologic: Grossly normal   Pelvic: External genitalia:  no lesions               Urethra:  normal appearing urethra with no masses, tenderness or lesions              Bartholins and Skenes: normal                 Vagina: normal appearing vagina with normal color and discharge, no lesions              Cervix: no lesions               Bimanual Exam:  Uterus:  normal size, contour, position, consistency, mobility, non-tender              Adnexa: no mass, fullness, tenderness               Rectovaginal: Confirms               Anus:  normal sphincter tone, no lesions  Chaperone was present for exam.  A:  Well Woman with normal exam  Family history of breast and colon cancer. Mother was 77 with her breast cancer diagnosis  P:   Genetic counseling recommended, she declines a referral, # given (she will consider)  She will do her labs with her primary, elevated lipids last year (not fasting)  Discussed Breast Self exam  Pap with hpv  Mammogram due, recommended she set up a 3D  Continue micronor

## 2018-02-08 LAB — CYTOLOGY - PAP
DIAGNOSIS: NEGATIVE
HPV (WINDOPATH): NOT DETECTED

## 2018-02-27 ENCOUNTER — Other Ambulatory Visit: Payer: Self-pay | Admitting: Obstetrics and Gynecology

## 2018-02-27 DIAGNOSIS — Z1231 Encounter for screening mammogram for malignant neoplasm of breast: Secondary | ICD-10-CM

## 2018-03-19 ENCOUNTER — Ambulatory Visit
Admission: RE | Admit: 2018-03-19 | Discharge: 2018-03-19 | Disposition: A | Discharge status: Home / Self Care | Payer: BLUE CROSS/BLUE SHIELD | Source: Ambulatory Visit | Attending: Obstetrics and Gynecology | Admitting: Obstetrics and Gynecology

## 2018-03-19 DIAGNOSIS — Z1231 Encounter for screening mammogram for malignant neoplasm of breast: Secondary | ICD-10-CM

## 2018-04-04 ENCOUNTER — Telehealth: Payer: Self-pay | Admitting: Obstetrics and Gynecology

## 2018-04-04 ENCOUNTER — Encounter: Payer: Self-pay | Admitting: Obstetrics and Gynecology

## 2018-04-04 NOTE — Telephone Encounter (Signed)
-----   Message from Mychart, Generic sent at 04/04/2018 11:30 AM EDT -----    Hi Dr. Oscar LaJertson,    What are your thoughts on me switching from Norlyda back to Estrostep/Tillia Fe? My periods are unpredictable, my PMS symptoms last 2-3 weeks, I haven't broken out this bad since I was about 18, and I've stopped smoking entirely (I haven't had a cigarette since March). If Estrostep is still a bad idea, are there any alternatives?    Thank you,  Gilman ButtnerKathy Nevels

## 2018-04-04 NOTE — Telephone Encounter (Signed)
Routing to Dr.Jertson for review. 

## 2018-04-05 MED ORDER — NORETHINDRON-ETHINYL ESTRAD-FE 1-20/1-30/1-35 MG-MCG PO TABS: 1.0000 | ORAL_TABLET | Freq: Every day | ORAL | 3 refills | Status: DC

## 2018-04-05 NOTE — Telephone Encounter (Signed)
I thinks since she is not smoking she can restart OCP's. Please call in enough to get her to her annual exam and let her know.

## 2018-04-05 NOTE — Telephone Encounter (Signed)
Spoke with patient, advised as seen below per Dr. Oscar LaJertson.   RX for Tilia Fe #[id number] to verified pharmacy on file. Patient verbalizes understanding and is agreeable. Encounter closed.

## 2018-05-03 ENCOUNTER — Ambulatory Visit (INDEPENDENT_AMBULATORY_CARE_PROVIDER_SITE_OTHER): Payer: BLUE CROSS/BLUE SHIELD | Admitting: Internal Medicine

## 2018-05-03 ENCOUNTER — Encounter: Payer: Self-pay | Admitting: Internal Medicine

## 2018-05-03 DIAGNOSIS — R21 Rash and other nonspecific skin eruption: Secondary | ICD-10-CM | POA: Diagnosis not present

## 2018-05-03 MED ORDER — METHYLPREDNISOLONE ACETATE 40 MG/ML IJ SUSP
40.0000 mg | Freq: Once | INTRAMUSCULAR | Status: AC
  Administered 2018-05-03: 40 mg via INTRAMUSCULAR

## 2018-05-03 MED ORDER — NYSTATIN-TRIAMCINOLONE 100000-0.1 UNIT/GM-% EX OINT: 1.0000 "application " | TOPICAL_OINTMENT | Freq: Two times a day (BID) | CUTANEOUS | 0 refills | Status: DC

## 2018-05-03 MED ORDER — TRIAMCINOLONE ACETONIDE 0.1 % EX CREA: 1.0000 "application " | TOPICAL_CREAM | Freq: Two times a day (BID) | CUTANEOUS | 0 refills | Status: DC

## 2018-05-03 NOTE — Progress Notes (Signed)
   Subjective:    Patient ID: Toni Wang, female    DOB: October 28, 1980, 37 y.o.   MRN: [id number]  HPI The patient is a 37 YO female coming in for rash on her left leg for about 7-10 days. Started with what she thought was a bug bite. She was itching and did scratch. When this did not go away in several days as normal she looked at it and saw a blistering rash. She denies exposure known to poison ivy but has dogs outdoors and thinks they could have been exposed. She denies fevers or chills. Since she saw the rash she stopped itching and used calamine lotion on it. This has not helped and it is stable since that time. Denies swelling in her leg or pain.   Review of Systems  Constitutional: Negative.   Respiratory: Negative for cough, chest tightness and shortness of breath.   Cardiovascular: Negative for chest pain, palpitations and leg swelling.  Gastrointestinal: Negative for abdominal distention, abdominal pain, constipation, diarrhea, nausea and vomiting.  Musculoskeletal: Negative.   Skin: Positive for color change and rash.  Neurological: Negative.       Objective:   Physical Exam  Constitutional: She is oriented to person, place, and time. She appears well-developed and well-nourished.  HENT:  Head: Normocephalic and atraumatic.  Eyes: EOM are normal.  Neck: Normal range of motion.  Cardiovascular: Normal rate and regular rhythm.  Pulmonary/Chest: Effort normal and breath sounds normal. No respiratory distress. She has no wheezes. She has no rales.  Musculoskeletal: She exhibits no edema.  Neurological: She is alert and oriented to person, place, and time. Coordination normal.  Skin: Skin is warm and dry. Rash noted.  Blistering rash on the left calf, no surrounding cellulitis, non-tender to touch, blanching   Vitals:   05/03/18 1050  BP: 122/80  Pulse: 70  Temp: 98.1 F (36.7 C)  TempSrc: Oral  SpO2: 98%  Weight: 157 lb (71.2 kg)  Height: 5' 4.5" (1.638 m)      Assessment & Plan:  Depo-medrol 40 mg IM

## 2018-05-03 NOTE — Assessment & Plan Note (Signed)
Appears to be contact dermatitis and given depo-medrol 40 mg IM and rx for triamcinolone cream BID to clear.

## 2018-05-03 NOTE — Addendum Note (Signed)
Addended by: Berton LanGULCH, BRIANA R on: 05/03/2018 11:47 AM   Modules accepted: Orders

## 2018-05-03 NOTE — Patient Instructions (Signed)
We have sent cream to use twice a day on the rash and have given you a shot of prednisone today which should clear the rash up.   You can use calamine lotion or benadryl cream or lotion as well.

## 2018-05-12 IMAGING — MG MM CLIP PLACEMENT
2 series · 2 of 2 positions shown · non-contrast
Comparison: Previous exam(s).

CLINICAL DATA: Post ultrasound-guided biopsy of an indeterminate
mass in the left breast at the 10 o'clock position and
ultrasound-guided biopsy of a lymph node with a mildly thickened
cortex in the left axilla.

EXAM:
DIAGNOSTIC LEFT MAMMOGRAM POST ULTRASOUND BIOPSY

[L ML]
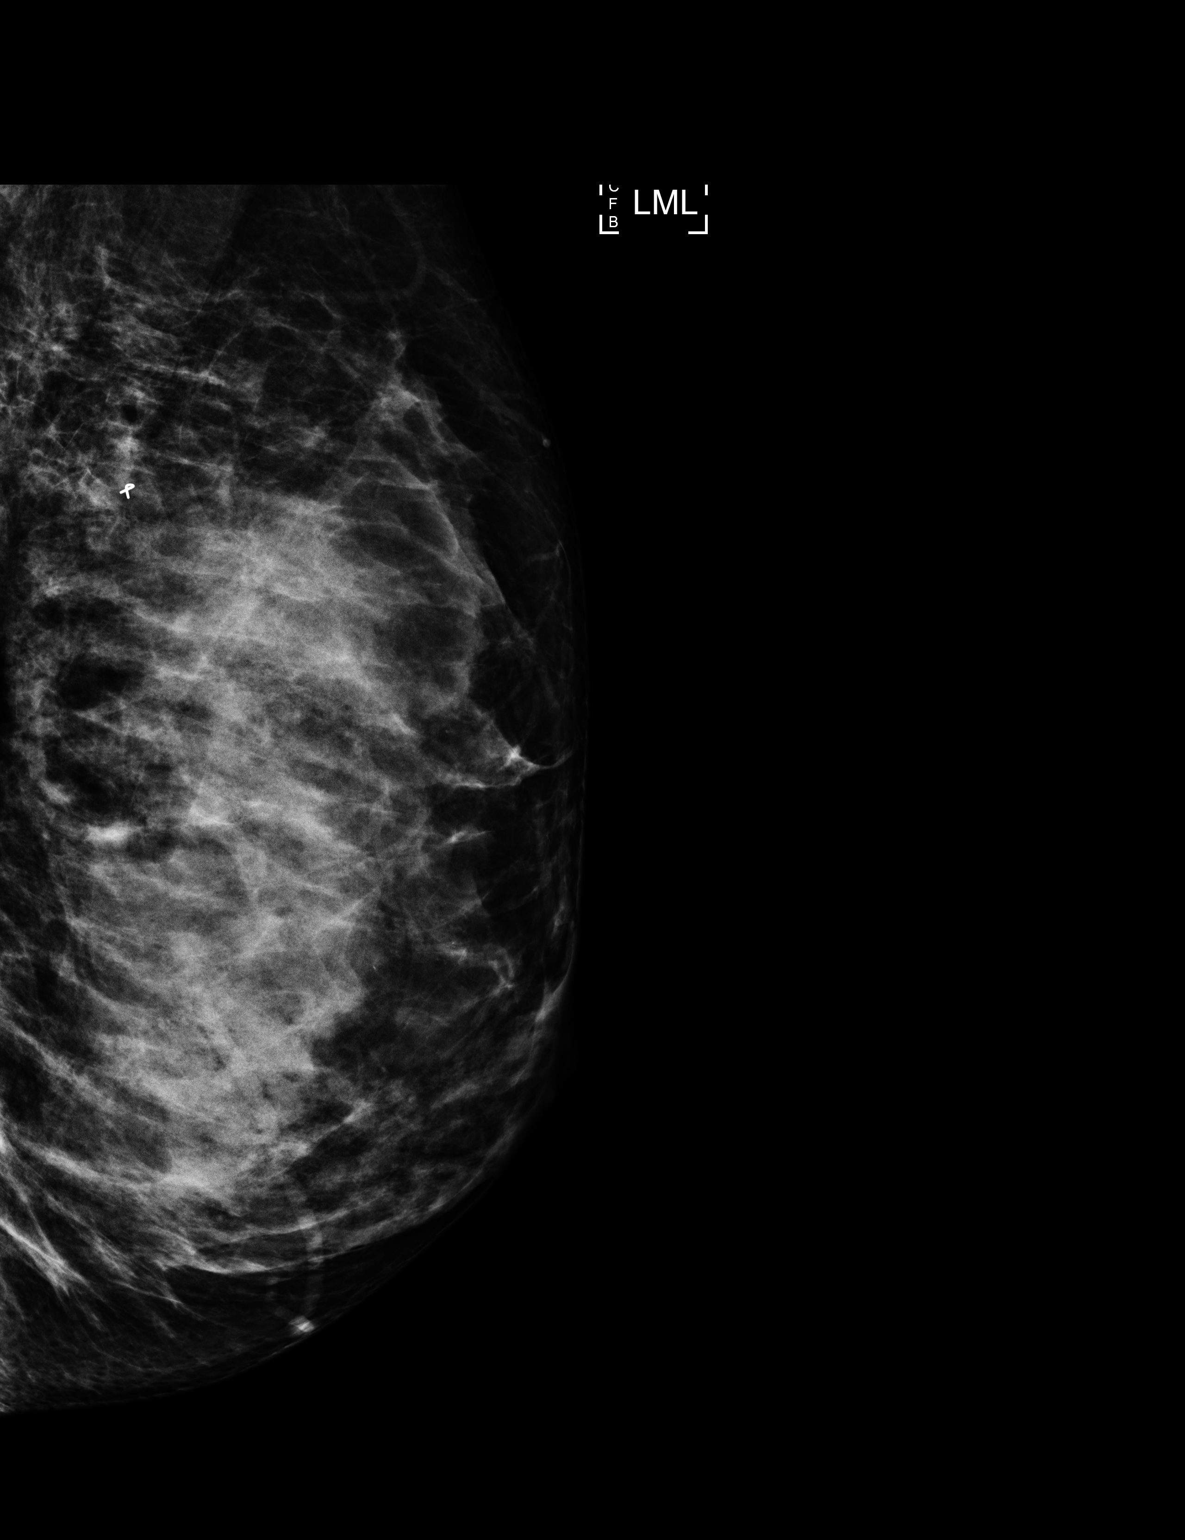

[L CC]
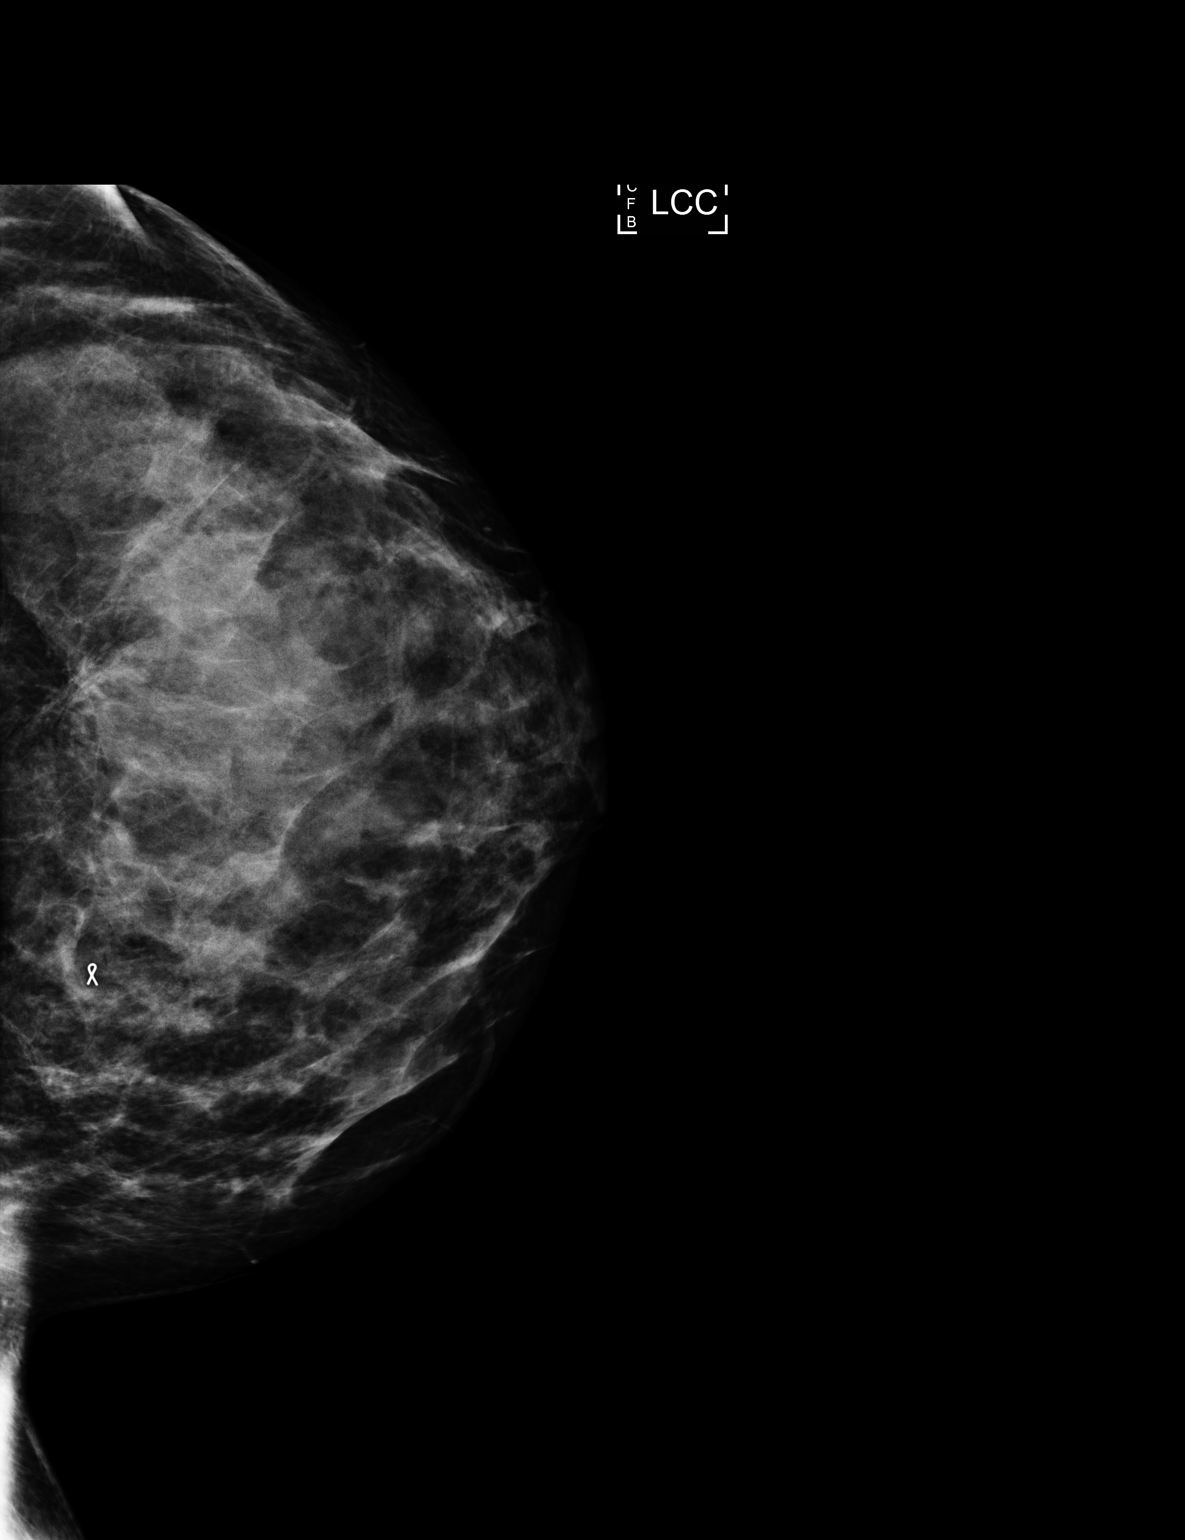

[2 of 2 positions shown; findings below may reference images not displayed]

FINDINGS: Mammographic images were obtained following ultrasound guided biopsy
of an indeterminate mass in the left breast at the 10 o'clock
position and ultrasound-guided biopsy of a lymph node with a mildly
thickened cortex in the left axilla. A ribbon shaped biopsy marking
clip is present at the site of the biopsied mass in the left breast
at the 10 o'clock position. A spiral shaped HydroMARK biopsy marking
clip is present at the site of the biopsied lymph node in the left
axilla, seen on the ML view only.
IMPRESSION: 1. Ribbon shaped biopsy marking clip at site of biopsied mass in the
left breast at the 10 o'clock position.

2. Spiral shaped HydroMARK biopsy marking clip at site of biopsied
lymph node in the left axilla.

Final Assessment: Post Procedure Mammograms for Marker Placement

## 2018-06-13 ENCOUNTER — Encounter: Payer: Self-pay | Admitting: Nurse Practitioner

## 2018-06-13 ENCOUNTER — Ambulatory Visit (INDEPENDENT_AMBULATORY_CARE_PROVIDER_SITE_OTHER): Payer: BLUE CROSS/BLUE SHIELD | Admitting: Nurse Practitioner

## 2018-06-13 ENCOUNTER — Ambulatory Visit: Payer: BLUE CROSS/BLUE SHIELD | Admitting: Family

## 2018-06-13 VITALS — BP 125/80 | HR 99 | Temp 99.0°F | Ht 64.5 in | Wt 154.0 lb

## 2018-06-13 DIAGNOSIS — L259 Unspecified contact dermatitis, unspecified cause: Secondary | ICD-10-CM

## 2018-06-13 MED ORDER — TRIAMCINOLONE ACETONIDE 0.5 % EX OINT: 1.0000 "application " | TOPICAL_OINTMENT | Freq: Two times a day (BID) | CUTANEOUS | 0 refills | Status: DC

## 2018-06-13 MED ORDER — CEPHALEXIN 500 MG PO CAPS: 500.0000 mg | ORAL_CAPSULE | Freq: Four times a day (QID) | ORAL | 0 refills | Status: DC

## 2018-06-13 MED ORDER — PREDNISONE 20 MG PO TABS: 40.0000 mg | ORAL_TABLET | Freq: Every day | ORAL | 0 refills | Status: DC

## 2018-06-13 NOTE — Progress Notes (Signed)
Name: Toni Wang   MRN: [id number]    DOB: [birth year]   Date:06/13/2018       Progress Note  Subjective  Chief Complaint  Chief Complaint  Patient presents with  . Toe Pain    right toes swelling. States she noticed a small bump on 06/10/18     HPI  Toni Wang is here today for evaluation of rash to right toes, first noticed one red bump to her right middle toe on Sunday night, by Monday night she had a red rash to all of her toes on right foot, then by last night her right toes had become swollen with blistering, peeling skin and clear drainage weeping from skin around her toes. She did walk outside barefoot to the car and back on Sunday prior to onset of symptoms, but does not recall any injuries, bites, She denies using new medications or products. She Has been cleaning and wrapping her toes in nonstick bandages at home, but symptoms seem to be getting worse. She denies fevers, chills, weakness, streaking redness, rash to other body parts. She says she overall Feels well    Patient Active Problem List   Diagnosis Date Noted  . Cervical lymphadenitis 12/18/2017  . Right knee pain 07/08/2016  . Rash 06/03/2015  . Dysuria 04/02/2013  . Other abnormal glucose 04/02/2013  . Routine general medical examination at a health care facility 04/02/2013    Social History   Tobacco Use  . Smoking status: Former Smoker    Packs/day: 1.00    Last attempt to quit: 11/10/2017    Years since quitting: 0.5  . Smokeless tobacco: Never Used  Substance Use Topics  . Alcohol use: No    Alcohol/week: 0.0 standard drinks     Current Outpatient Medications:  .  diphenhydrAMINE (BENADRYL) 25 MG tablet, Take 25 mg by mouth every 6 (six) hours as needed. For allergies, Disp: , Rfl:  .  Multiple Vitamins-Minerals (AIRBORNE PO), Take by mouth daily., Disp: , Rfl:  .  norethindrone-ethinyl estradiol-iron (TILIA FE) 1-20/1-30/1-35 MG-MCG tablet, Take 1 tablet by mouth daily., Disp: 3  Package, Rfl: 3 .  triamcinolone cream (KENALOG) 0.1 %, Apply 1 application topically 2 (two) times daily., Disp: 100 g, Rfl: 0  No Known Allergies  ROS  No other specific complaints in a complete review of systems (except as listed in HPI above).  Objective  Vitals:   06/13/18 1023 06/13/18 1103  BP: (!) 130/100 125/80  Pulse: 99   Temp: 99 F (37.2 C)   TempSrc: Oral   SpO2: 97%   Weight: 154 lb (69.9 kg)   Height: 5' 4.5" (1.638 m)     Body mass index is 26.03 kg/m.  Nursing Note and Vital Signs reviewed.  Physical Exam  Constitutional: Patient appears well-developed and well-nourished.  No distress.  HEENT: head atraumatic, normocephalic, pupils equal and reactive to light, EOM's intact, neck supple, oropharynx pink and moist without exudate Cardiovascular: Normal rate, regular rhythm, distal pulses intact. Pulmonary/Chest: Effort normal , No respiratory distress or retractions. Musculoskeletal: Normal range of motion, No gross deformities. Right toes are swollen. Neurological: She is alert and oriented to person, place, and time. No cranial nerve deficit. Coordination, balance, strength, speech and gait are normal.  Skin: Skin is warm and dry. Toes of right foot erythematous with blistering, peeling skin, clear exudate. Psychiatric: Patient has a normal mood and affect. behavior is normal. Judgment and thought content normal.   Assessment & Plan  Contact  dermatitis, unspecified contact dermatitis type, unspecified trigger Will treat for contact dermatitis, possible cellulitis with course of steroids, abx- dosing and side effects discussed Home management, Red flags and when to present for emergency care or RTC including fever >101.73F,  new/worsening/un-resolving symptoms,  reviewed with patient at time of visit. Follow up and care instructions discussed and provided in AVS. - predniSONE (DELTASONE) 20 MG tablet; Take 2 tablets (40 mg total) by mouth daily with  breakfast.  Dispense: 10 tablet; Refill: 0 - triamcinolone ointment (KENALOG) 0.5 %; Apply 1 application topically 2 (two) times daily.  Dispense: 30 g; Refill: 0 - cephALEXin (KEFLEX) 500 MG capsule; Take 1 capsule (500 mg total) by mouth 4 (four) times daily.  Dispense: 20 capsule; Refill: 0

## 2018-06-13 NOTE — Patient Instructions (Signed)
Please start keflex antibiotic 4 times daily for 5 days  Please start triamcinolone ointment four times daily for 1 week  Please start prednisone 40mg  daily for five days.  Please follow up for fevers over 101, increased swelling or spreading redness, if your symptoms get worse, or if your symptoms dont get better with treatment today.  Contact Dermatitis Dermatitis is redness, soreness, and swelling (inflammation) of the skin. Contact dermatitis is a reaction to certain substances that touch the skin. You either touched something that irritated your skin, or you have allergies to something you touched. Follow these instructions at home: Skin Care  Moisturize your skin as needed.  Apply cool compresses to the affected areas.  Try taking a bath with: ? Epsom salts. Follow the instructions on the package. You can get these at a pharmacy or grocery store. ? Baking soda. Pour a small amount into the bath as told by your doctor. ? Colloidal oatmeal. Follow the instructions on the package. You can get this at a pharmacy or grocery store.  Try applying baking soda paste to your skin. Stir water into baking soda until it looks like paste.  Do not scratch your skin.  Bathe less often.  Bathe in lukewarm water. Avoid using hot water. Medicines  Take or apply over-the-counter and prescription medicines only as told by your doctor.  If you were prescribed an antibiotic medicine, take or apply your antibiotic as told by your doctor. Do not stop taking the antibiotic even if your condition starts to get better. General instructions  Keep all follow-up visits as told by your doctor. This is important.  Avoid the substance that caused your reaction. If you do not know what caused it, keep a journal to try to track what caused it. Write down: ? What you eat. ? What cosmetic products you use. ? What you drink. ? What you wear in the affected area. This includes jewelry.  If you were given a  bandage (dressing), take care of it as told by your doctor. This includes when to change and remove it. Contact a doctor if:  You do not get better with treatment.  Your condition gets worse.  You have signs of infection such as: ? Swelling. ? Tenderness. ? Redness. ? Soreness. ? Warmth.  You have a fever.  You have new symptoms. Get help right away if:  You have a very bad headache.  You have neck pain.  Your neck is stiff.  You throw up (vomit).  You feel very sleepy.  You see red streaks coming from the affected area.  Your bone or joint underneath the affected area becomes painful after the skin has healed.  The affected area turns darker.  You have trouble breathing. This information is not intended to replace advice given to you by your health care provider. Make sure you discuss any questions you have with your health care provider. Document Released: 06/26/2009 Document Revised: 02/04/2016 Document Reviewed: 01/14/2015 Elsevier Interactive Patient Education  2018 [employer].

## 2018-07-11 NOTE — Progress Notes (Signed)
Subjective:    Patient ID: Toni Wang, female    DOB: 1981/07/08, 37 y.o.   MRN: [id number]  HPI The patient is here for an acute visit.  Rash:  She was seen almost one month ago for a rash on her right toes.  It started w/o obvious cause - she was outside briefly before it started, but denies a bite or injury.  She was diagnosed with contact dermatitis and prescribed prednisone 40 mg daily x 5 days, triamcinolone ointment and keflex 500 mg four times daily x 5 days.   She states her symptoms cleared up after a few days and when she finished treatment they were gone.  Yesterday the symptoms came back and she is afraid they will continue to worsen.  She has redness and swelling the right distal foot and toes.  The area is warm, itches, Keigo Whalley and hurts.  She denies any blisters or wounds.  She does have a bug bite on the top of her foot and a scratch mark next to it.  She denies any fever or chills.    She has been soaking her foot.    Medications and allergies reviewed with patient and updated if appropriate.  Patient Active Problem List   Diagnosis Date Noted  . Cervical lymphadenitis 12/18/2017  . Right knee pain 07/08/2016  . Rash 06/03/2015  . Dysuria 04/02/2013  . Other abnormal glucose 04/02/2013  . Routine general medical examination at a health care facility 04/02/2013    Current Outpatient Medications on File Prior to Visit  Medication Sig Dispense Refill  . diphenhydrAMINE (BENADRYL) 25 MG tablet Take 25 mg by mouth every 6 (six) hours as needed. For allergies    . Multiple Vitamins-Minerals (AIRBORNE PO) Take by mouth daily.    . norethindrone-ethinyl estradiol-iron (TILIA FE) 1-20/1-30/1-35 MG-MCG tablet Take 1 tablet by mouth daily. 3 Package 3  . triamcinolone cream (KENALOG) 0.1 % Apply 1 application topically 2 (two) times daily. 100 g 0  . triamcinolone ointment (KENALOG) 0.5 % Apply 1 application topically 2 (two) times daily. 30 g 0   No current  facility-administered medications on file prior to visit.     Past Medical History:  Diagnosis Date  . Allergy   . Heart murmur    as a child  . Irregular menses   . Multiple thyroid nodules    Bilateral    Past Surgical History:  Procedure Laterality Date  . BREAST LUMPECTOMY Left 2001   benign    Social History   Socioeconomic History  . Marital status: Married    Spouse name: Not on file  . Number of children: Not on file  . Years of education: Not on file  . Highest education level: Not on file  Occupational History  . Not on file  Social Needs  . Financial resource strain: Not on file  . Food insecurity:    Worry: Not on file    Inability: Not on file  . Transportation needs:    Medical: Not on file    Non-medical: Not on file  Tobacco Use  . Smoking status: Former Smoker    Packs/day: 1.00    Last attempt to quit: 11/10/2017    Years since quitting: 0.6  . Smokeless tobacco: Never Used  Substance and Sexual Activity  . Alcohol use: No    Alcohol/week: 0.0 standard drinks  . Drug use: Not Currently    Types: Marijuana  . Sexual activity: Not Currently  Partners: Male    Birth control/protection: Pill  Lifestyle  . Physical activity:    Days per week: Not on file    Minutes per session: Not on file  . Stress: Not on file  Relationships  . Social connections:    Talks on phone: Not on file    Gets together: Not on file    Attends religious service: Not on file    Active member of club or organization: Not on file    Attends meetings of clubs or organizations: Not on file    Relationship status: Not on file  Other Topics Concern  . Not on file  Social History Narrative  . Not on file    Family History  Problem Relation Age of Onset  . Hypertension Father   . Cancer Father        throat and lung cancer  . Breast cancer Mother 65  . Diabetes Maternal Grandmother   . Breast cancer Maternal Grandmother 60  . Colon cancer Maternal Grandmother  90       Colon  . Heart disease Maternal Grandfather   . Colon cancer Paternal Grandmother 71       Colon  . Breast cancer Maternal Aunt   . Depression Neg Hx   . Stroke Neg Hx     Review of Systems Per HPI    Objective:   Vitals:   07/12/18 0743  BP: 122/78  Pulse: 88  Resp: 16  Temp: 98.3 F (36.8 C)  SpO2: 98%   BP Readings from Last 3 Encounters:  07/12/18 122/78  06/13/18 125/80  05/03/18 122/80   Wt Readings from Last 3 Encounters:  07/12/18 157 lb 12.8 oz (71.6 kg)  06/13/18 154 lb (69.9 kg)  05/03/18 157 lb (71.2 kg)   Body mass index is 26.67 kg/m.   Physical Exam  Constitutional: She appears well-developed and well-nourished. No distress.  HENT:  Head: Normocephalic and atraumatic.  Skin: Skin is warm and dry. She is not diaphoretic.  Right distal foot and toes with mild swelling and erythema, mild warmth, bug bite on distal dorsal foot and scratch mark next to it - no open wound or discharge           Assessment & Plan:    See Problem List for Assessment and Plan of chronic medical problems.

## 2018-07-12 ENCOUNTER — Encounter: Payer: Self-pay | Admitting: Internal Medicine

## 2018-07-12 ENCOUNTER — Ambulatory Visit (INDEPENDENT_AMBULATORY_CARE_PROVIDER_SITE_OTHER): Payer: BLUE CROSS/BLUE SHIELD | Admitting: Internal Medicine

## 2018-07-12 VITALS — BP 122/78 | HR 88 | Temp 98.3°F | Resp 16 | Ht 64.5 in | Wt 157.8 lb

## 2018-07-12 DIAGNOSIS — L0390 Cellulitis, unspecified: Secondary | ICD-10-CM | POA: Insufficient documentation

## 2018-07-12 MED ORDER — DOXYCYCLINE HYCLATE 100 MG PO TABS: 100.0000 mg | ORAL_TABLET | Freq: Two times a day (BID) | ORAL | 0 refills | Status: DC

## 2018-07-12 MED ORDER — PREDNISONE 10 MG PO TABS: ORAL_TABLET | ORAL | 0 refills | Status: DC

## 2018-07-12 NOTE — Assessment & Plan Note (Signed)
Likely cellulitis, but contact dermatitis is possible Prednisone and keflex did work, but symptoms returned Start doxycycline bid x 10 days - if symptoms worsen or do not improve over the next 48 hrs start prednisone taper  Call if no improvement

## 2018-07-12 NOTE — Patient Instructions (Signed)
Take the antibiotic - doxycycline as directed - complete the entire course.   If there is no improvement in the next 48 hrs start the prednisone taper.    Call with any questions

## 2018-12-03 ENCOUNTER — Encounter: Payer: Self-pay | Admitting: Internal Medicine

## 2018-12-03 ENCOUNTER — Other Ambulatory Visit: Payer: Self-pay | Admitting: Internal Medicine

## 2018-12-03 DIAGNOSIS — L309 Dermatitis, unspecified: Secondary | ICD-10-CM | POA: Insufficient documentation

## 2018-12-03 MED ORDER — FLUOCINONIDE-E 0.05 % EX CREA: 1.0000 "application " | TOPICAL_CREAM | Freq: Two times a day (BID) | CUTANEOUS | 1 refills | Status: DC

## 2018-12-03 MED ORDER — METHYLPREDNISOLONE 4 MG PO TBPK: ORAL_TABLET | ORAL | 0 refills | Status: AC

## 2019-01-09 ENCOUNTER — Telehealth: Payer: BLUE CROSS/BLUE SHIELD | Admitting: Family

## 2019-01-09 DIAGNOSIS — L255 Unspecified contact dermatitis due to plants, except food: Secondary | ICD-10-CM | POA: Diagnosis not present

## 2019-01-09 MED ORDER — PREDNISONE 10 MG (21) PO TBPK: ORAL_TABLET | ORAL | 0 refills | Status: DC

## 2019-01-09 NOTE — Progress Notes (Signed)

## 2019-02-08 ENCOUNTER — Telehealth: Payer: BLUE CROSS/BLUE SHIELD | Admitting: Family

## 2019-02-08 DIAGNOSIS — L258 Unspecified contact dermatitis due to other agents: Secondary | ICD-10-CM

## 2019-02-08 MED ORDER — PREDNISONE 10 MG (21) PO TBPK: ORAL_TABLET | ORAL | 0 refills | Status: DC

## 2019-02-08 NOTE — Progress Notes (Signed)
Greater than 5 minutes, yet less than 10 minutes of time have been spent researching, coordinating, and implementing care for this patient today.  Thank you for the details you included in the comment boxes. Those details are very helpful in determining the best course of treatment for you and help [place] to provide the best care.  I won't send you to Urgent Care just yet, but it does look severe. Please consider going if it worsens and/or does not improve in 24-48h with the treatment below. This is beyond the point of topical treatment and requires an oral prednisone treatment. If it becomes hot to the touch, is weeping/peaking non-clear fluid, you develop a fever, or joint pain, or loss of range of motion, you need to be seen face to face ASAP. See plan below.  E Visit for Rash  We are sorry that you are not feeling well. Here is how we plan to help!  Based on what you shared with me it looks like you have contact dermatitis.  Contact dermatitis is a skin rash caused by something that touches the skin and causes irritation or inflammation.  Your skin may be red, swollen, dry, cracked, and itch.  The rash should go away in a few days but can last a few weeks.  If you get a rash, it's important to figure out what caused it so the irritant can be avoided in the future.   Prednisone 10 mg daily for 6 days (see taper instructions below)  Directions for 6 day taper: Day 1: 2 tablets before breakfast, 1 after both lunch & dinner and 2 at bedtime Day 2: 1 tab before breakfast, 1 after both lunch & dinner and 2 at bedtime Day 3: 1 tab at each meal & 1 at bedtime Day 4: 1 tab at breakfast, 1 at lunch, 1 at bedtime Day 5: 1 tab at breakfast & 1 tab at bedtime Day 6: 1 tab at breakfast     HOME CARE:   Take cool showers and avoid direct sunlight.  Apply cool compress or wet dressings.  Take a bath in an oatmeal bath.  Sprinkle content of one Aveeno packet under running faucet with comfortably warm  water.  Bathe for 15-20 minutes, 1-2 times daily.  Pat dry with a towel. Do not rub the rash.  Use hydrocortisone cream.  Take an antihistamine like Benadryl for widespread rashes that itch.  The adult dose of Benadryl is 25-50 mg by mouth 4 times daily.  Caution:  This type of medication may cause sleepiness.  Do not drink alcohol, drive, or operate dangerous machinery while taking antihistamines.  Do not take these medications if you have prostate enlargement.  Read package instructions thoroughly on all medications that you take.  GET HELP RIGHT AWAY IF:   Symptoms don't go away after treatment.  Severe itching that persists.  If you rash spreads or swells.  If you rash begins to smell.  If it blisters and opens or develops a yellow-brown crust.  You develop a fever.  You have a sore throat.  You become short of breath.  MAKE SURE YOU:  Understand these instructions. Will watch your condition. Will get help right away if you are not doing well or get worse.  Thank you for choosing an e-visit. Your e-visit answers were reviewed by a board certified advanced clinical practitioner to complete your personal care plan. Depending upon the condition, your plan could have included both over the counter or prescription  medications. Please review your pharmacy choice. Be sure that the pharmacy you have chosen is open so that you can pick up your prescription now.  If there is a problem you may message your provider in MyChart to have the prescription routed to another pharmacy. Your safety is important to us. If you have drug allergies check your prescription carefully.  For the next 24 hours, you can use MyChart to ask questions about today's visit, request a non-urgent call back, or ask for a work or school excuse from your e-visit provider. You will get an email in the next two days asking about your experience. I hope that your e-visit has been valuable and will speed your  recovery.

## 2019-02-25 ENCOUNTER — Other Ambulatory Visit: Payer: Self-pay

## 2019-02-26 NOTE — Progress Notes (Signed)
38 y.o. G0P0 Legally Separated White or Caucasian Not Hispanic or Latino female here for annual exam.  Getting divorced, doing fine.  Period Cycle (Days): 28 Period Duration (Days): 3-4 days Period Pattern: Regular Menstrual Flow: Moderate Menstrual Control: Thin pad, Tampon Menstrual Control Change Freq (Hours): changes tampon/pad every 5-6 hours Dysmenorrhea: (!) Mild Dysmenorrhea Symptoms: Cramping  Patient's last menstrual period was 02/06/2019 (exact date).          Sexually active: No.  The current method of family planning is OCP (estrogen/progesterone).    Exercising: Yes.    walking, running Smoker:  no  Health Maintenance: Pap:  02/07/2018 WNL NEG HPV, 12-04-14 WNL NEG HR HPV  History of abnormal Pap:  no MMG: 03/19/2018 Birads 1 negative Colonoscopy:  Never BMD:   Never TDaP:  04-30-09, would like today Gardasil: No, information given.     reports that she quit smoking about 15 months ago. She smoked 1.00 pack per day. She has never used smokeless tobacco. She reports previous drug use. She reports that she does not drink alcohol. Works as a Librarian, academiclegal assistant.   Past Medical History:  Diagnosis Date  . Allergy   . Heart murmur    as a child  . Irregular menses   . Multiple thyroid nodules    Bilateral    Past Surgical History:  Procedure Laterality Date  . BREAST LUMPECTOMY Left 2001   benign    Current Outpatient Medications  Medication Sig Dispense Refill  . Multiple Vitamins-Minerals (AIRBORNE PO) Take by mouth daily.    . norethindrone-ethinyl estradiol-iron (TILIA FE) 1-20/1-30/1-35 MG-MCG tablet Take 1 tablet by mouth daily. 3 Package 3   No current facility-administered medications for this visit.     Family History  Problem Relation Age of Onset  . Hypertension Father   . Cancer Father        throat and lung cancer  . Breast cancer Mother [id number]  . Diabetes Maternal Grandmother   . Breast cancer Maternal Grandmother 60  . Colon cancer Maternal  Grandmother 90       Colon  . Heart disease Maternal Grandfather   . Colon cancer Paternal Grandmother [id number]       Colon  . Breast cancer Maternal Aunt   . Depression Neg Hx   . Stroke Neg Hx     Review of Systems  Constitutional: Negative.   HENT: Negative.   Eyes: Negative.   Respiratory: Negative.   Cardiovascular: Negative.   Gastrointestinal: Negative.   Endocrine: Negative.   Genitourinary: Negative.   Musculoskeletal: Negative.   Skin: Negative.   Allergic/Immunologic: Negative.   Neurological: Negative.   Hematological: Negative.   Psychiatric/Behavioral: Negative.     Exam:   BP 120/82 (BP Location: Left Arm, Patient Position: Sitting, Cuff Size: Normal)   Pulse 88   Temp 98.4 F (36.9 C) (Skin)   Ht 5' 4.57" (1.64 m)   Wt 160 lb (72.6 kg)   LMP 02/06/2019 (Exact Date)   BMI 26.98 kg/m   Weight change: @WEIGHTCHANGE @ Height:   Height: 5' 4.57" (164 cm)  Ht Readings from Last 3 Encounters:  02/27/19 5' 4.57" (1.64 m)  07/12/18 5' 4.5" (1.638 m)  06/13/18 5' 4.5" (1.638 m)    General appearance: alert, cooperative and appears stated age Head: Normocephalic, without obvious abnormality, atraumatic Neck: no adenopathy, supple, symmetrical, trachea midline and thyroid normal to inspection and palpation Lungs: clear to auscultation bilaterally Cardiovascular: regular rate and rhythm Breasts: normal appearance, no  masses or tenderness Abdomen: soft, non-tender; non distended,  no masses,  no organomegaly Extremities: extremities normal, atraumatic, no cyanosis or edema Skin: Skin color, texture, turgor normal. No rashes or lesions Lymph nodes: Cervical, supraclavicular, and axillary nodes normal. No abnormal inguinal nodes palpated Neurologic: Grossly normal   Pelvic: External genitalia:  no lesions              Urethra:  normal appearing urethra with no masses, tenderness or lesions              Bartholins and Skenes: normal                 Vagina: normal  appearing vagina with normal color and discharge, no lesions              Cervix: no lesions               Bimanual Exam:  Uterus:  normal size, contour, position, consistency, mobility, non-tender and retroverted              Adnexa: no mass, fullness, tenderness               Rectovaginal: Confirms               Anus:  normal sphincter tone, no lesions  Chaperone was present for exam.  A:  Well Woman with normal exam  Family history of breast cancer, she has declined referral to genetics (has the # to call if she changes her mind). Aware of possible changes to management after evaluation  P:   No pap this year  Screening labs with primary  Mammogram next month, will do 3D  Continue OCP's  Discussed breast self exam  Discussed calcium and vit D intake  Gardasil information given  TDAP today

## 2019-02-27 ENCOUNTER — Encounter: Payer: Self-pay | Admitting: Obstetrics and Gynecology

## 2019-02-27 ENCOUNTER — Ambulatory Visit (INDEPENDENT_AMBULATORY_CARE_PROVIDER_SITE_OTHER): Payer: BC Managed Care – PPO | Admitting: Obstetrics and Gynecology

## 2019-02-27 ENCOUNTER — Other Ambulatory Visit: Payer: Self-pay

## 2019-02-27 VITALS — BP 120/82 | HR 88 | Temp 98.4°F | Ht 64.57 in | Wt 160.0 lb

## 2019-02-27 DIAGNOSIS — Z23 Encounter for immunization: Secondary | ICD-10-CM | POA: Diagnosis not present

## 2019-02-27 DIAGNOSIS — Z803 Family history of malignant neoplasm of breast: Secondary | ICD-10-CM | POA: Diagnosis not present

## 2019-02-27 DIAGNOSIS — Z01419 Encounter for gynecological examination (general) (routine) without abnormal findings: Secondary | ICD-10-CM | POA: Diagnosis not present

## 2019-02-27 DIAGNOSIS — Z3041 Encounter for surveillance of contraceptive pills: Secondary | ICD-10-CM

## 2019-02-27 MED ORDER — TILIA FE 1-20/1-30/1-35 MG-MCG PO TABS: 1.0000 | ORAL_TABLET | Freq: Every day | ORAL | 3 refills | Status: DC

## 2019-02-27 NOTE — Patient Instructions (Signed)
EXERCISE AND DIET:  We recommended that you start or continue a regular exercise program for good health. Regular exercise means any activity that makes your heart beat faster and makes you sweat.  We recommend exercising at least 30 minutes per day at least 3 days a week, preferably 4 or 5.  We also recommend a diet low in fat and sugar.  Inactivity, poor dietary choices and obesity can cause diabetes, heart attack, stroke, and kidney damage, among others.    ALCOHOL AND SMOKING:  Women should limit their alcohol intake to no more than 7 drinks/beers/glasses of wine (combined, not each!) per week. Moderation of alcohol intake to this level decreases your risk of breast cancer and liver damage. And of course, no recreational drugs are part of a healthy lifestyle.  And absolutely no smoking or even second hand smoke. Most people know smoking can cause heart and lung diseases, but did you know it also contributes to weakening of your bones? Aging of your skin?  Yellowing of your teeth and nails?  CALCIUM AND VITAMIN D:  Adequate intake of calcium and Vitamin D are recommended.  The recommendations for exact amounts of these supplements seem to change often, but generally speaking 1,000 mg of calcium (between diet and supplement) and 800 units of Vitamin D per day seems prudent. Certain women may benefit from higher intake of Vitamin D.  If you are among these women, your doctor will have told you during your visit.    PAP SMEARS:  Pap smears, to check for cervical cancer or precancers,  have traditionally been done yearly, although recent scientific advances have shown that most women can have pap smears less often.  However, every woman still should have a physical exam from her gynecologist every year. It will include a breast check, inspection of the vulva and vagina to check for abnormal growths or skin changes, a visual exam of the cervix, and then an exam to evaluate the size and shape of the uterus and  ovaries.  And after 38 years of age, a rectal exam is indicated to check for rectal cancers. We will also provide age appropriate advice regarding health maintenance, like when you should have certain vaccines, screening for sexually transmitted diseases, bone density testing, colonoscopy, mammograms, etc.   MAMMOGRAMS:  All women over 40 years old should have a yearly mammogram. Many facilities now offer a "3D" mammogram, which may cost around $50 extra out of pocket. If possible,  we recommend you accept the option to have the 3D mammogram performed.  It both reduces the number of women who will be called back for extra views which then turn out to be normal, and it is better than the routine mammogram at detecting truly abnormal areas.    COLON CANCER SCREENING: Now recommend starting at age 45. At this time colonoscopy is not covered for routine screening until 50. There are take home tests that can be done between 45-49.   COLONOSCOPY:  Colonoscopy to screen for colon cancer is recommended for all women at age 50.  We know, you hate the idea of the prep.  We agree, BUT, having colon cancer and not knowing it is worse!!  Colon cancer so often starts as a polyp that can be seen and removed at colonscopy, which can quite literally save your life!  And if your first colonoscopy is normal and you have no family history of colon cancer, most women don't have to have it again for   10 years.  Once every ten years, you can do something that may end up saving your life, right?  We will be happy to help you get it scheduled when you are ready.  Be sure to check your insurance coverage so you understand how much it will cost.  It may be covered as a preventative service at no cost, but you should check your particular policy.      Breast Self-Awareness Breast self-awareness means being familiar with how your breasts look and feel. It involves checking your breasts regularly and reporting any changes to your  health care provider. Practicing breast self-awareness is important. A change in your breasts can be a sign of a serious medical problem. Being familiar with how your breasts look and feel allows you to find any problems early, when treatment is more likely to be successful. All women should practice breast self-awareness, including women who have had breast implants. How to do a breast self-exam One way to learn what is normal for your breasts and whether your breasts are changing is to do a breast self-exam. To do a breast self-exam: Look for Changes  1. Remove all the clothing above your waist. 2. Stand in front of a mirror in a room with good lighting. 3. Put your hands on your hips. 4. Push your hands firmly downward. 5. Compare your breasts in the mirror. Look for differences between them (asymmetry), such as: ? Differences in shape. ? Differences in size. ? Puckers, dips, and bumps in one breast and not the other. 6. Look at each breast for changes in your skin, such as: ? Redness. ? Scaly areas. 7. Look for changes in your nipples, such as: ? Discharge. ? Bleeding. ? Dimpling. ? Redness. ? A change in position. Feel for Changes Carefully feel your breasts for lumps and changes. It is best to do this while lying on your back on the floor and again while sitting or standing in the shower or tub with soapy water on your skin. Feel each breast in the following way:  Place the arm on the side of the breast you are examining above your head.  Feel your breast with the other hand.  Start in the nipple area and make  inch (2 cm) overlapping circles to feel your breast. Use the pads of your three middle fingers to do this. Apply light pressure, then medium pressure, then firm pressure. The light pressure will allow you to feel the tissue closest to the skin. The medium pressure will allow you to feel the tissue that is a little deeper. The firm pressure will allow you to feel the tissue  close to the ribs.  Continue the overlapping circles, moving downward over the breast until you feel your ribs below your breast.  Move one finger-width toward the center of the body. Continue to use the  inch (2 cm) overlapping circles to feel your breast as you move slowly up toward your collarbone.  Continue the up and down exam using all three pressures until you reach your armpit.  Write Down What You Find  Write down what is normal for each breast and any changes that you find. Keep a written record with breast changes or normal findings for each breast. By writing this information down, you do not need to depend only on memory for size, tenderness, or location. Write down where you are in your menstrual cycle, if you are still menstruating. If you are having trouble noticing differences   in your breasts, do not get discouraged. With time you will become more familiar with the variations in your breasts and more comfortable with the exam. How often should I examine my breasts? Examine your breasts every month. If you are breastfeeding, the best time to examine your breasts is after a feeding or after using a breast pump. If you menstruate, the best time to examine your breasts is 5-7 days after your period is over. During your period, your breasts are lumpier, and it may be more difficult to notice changes. When should I see my health care provider? See your health care provider if you notice:  A change in shape or size of your breasts or nipples.  A change in the skin of your breast or nipples, such as a reddened or scaly area.  Unusual discharge from your nipples.  A lump or thick area that was not there before.  Pain in your breasts.  Anything that concerns you.  

## 2019-04-01 ENCOUNTER — Telehealth: Payer: BLUE CROSS/BLUE SHIELD | Admitting: Family

## 2019-04-01 DIAGNOSIS — L255 Unspecified contact dermatitis due to plants, except food: Secondary | ICD-10-CM

## 2019-04-01 MED ORDER — TRIAMCINOLONE ACETONIDE 0.1 % EX CREA: 1.0000 "application " | TOPICAL_CREAM | Freq: Two times a day (BID) | CUTANEOUS | 0 refills | Status: DC

## 2019-04-01 MED ORDER — PREDNISONE 10 MG (21) PO TBPK: ORAL_TABLET | ORAL | 0 refills | Status: DC

## 2019-04-01 MED ORDER — CEPHALEXIN 500 MG PO CAPS: 500.0000 mg | ORAL_CAPSULE | Freq: Three times a day (TID) | ORAL | 0 refills | Status: DC

## 2019-04-01 NOTE — Addendum Note (Signed)
Addended by: Evelina Dun A on: 04/01/2019 09:14 PM   Modules accepted: Orders

## 2019-04-01 NOTE — Progress Notes (Signed)

## 2019-04-26 ENCOUNTER — Ambulatory Visit: Payer: BLUE CROSS/BLUE SHIELD | Admitting: Internal Medicine

## 2019-05-22 ENCOUNTER — Telehealth: Payer: Self-pay

## 2019-05-22 ENCOUNTER — Telehealth: Payer: BC Managed Care – PPO | Admitting: Family

## 2019-05-22 DIAGNOSIS — L255 Unspecified contact dermatitis due to plants, except food: Secondary | ICD-10-CM

## 2019-05-22 MED ORDER — PREDNISONE 10 MG (21) PO TBPK: ORAL_TABLET | ORAL | 0 refills | Status: DC

## 2019-05-22 MED ORDER — TRIAMCINOLONE ACETONIDE 0.1 % EX CREA: 1.0000 "application " | TOPICAL_CREAM | Freq: Two times a day (BID) | CUTANEOUS | 0 refills | Status: DC

## 2019-05-22 NOTE — Progress Notes (Signed)

## 2020-01-23 ENCOUNTER — Other Ambulatory Visit: Payer: Self-pay

## 2020-01-23 MED ORDER — TILIA FE 1-20/1-30/1-35 MG-MCG PO TABS: 1.0000 | ORAL_TABLET | Freq: Every day | ORAL | 1 refills | Status: DC

## 2020-01-23 NOTE — Telephone Encounter (Signed)
Medication refill request: tilia fe Last AEX:  02-27-2019 Next AEX: 06-11-2020 Last MMG (if hormonal medication request): 03-20-2018 category b density birads 1:neg Refill authorized: please approve until aex if appropriate.

## 2020-02-26 ENCOUNTER — Encounter: Payer: Self-pay | Admitting: Family

## 2020-02-26 ENCOUNTER — Other Ambulatory Visit: Payer: Self-pay

## 2020-02-26 ENCOUNTER — Ambulatory Visit (INDEPENDENT_AMBULATORY_CARE_PROVIDER_SITE_OTHER): Payer: BC Managed Care – PPO | Admitting: Family

## 2020-02-26 VITALS — BP 138/90 | HR 105 | Temp 98.9°F | Wt 164.0 lb

## 2020-02-26 DIAGNOSIS — L03032 Cellulitis of left toe: Secondary | ICD-10-CM

## 2020-02-26 DIAGNOSIS — T63441A Toxic effect of venom of bees, accidental (unintentional), initial encounter: Secondary | ICD-10-CM | POA: Diagnosis not present

## 2020-02-26 DIAGNOSIS — T7840XA Allergy, unspecified, initial encounter: Secondary | ICD-10-CM

## 2020-02-26 MED ORDER — FAMOTIDINE 20 MG PO TABS
20.0000 mg | ORAL_TABLET | Freq: Two times a day (BID) | ORAL | 0 refills | Status: DC
Start: 2020-02-26 — End: 2020-04-22

## 2020-02-26 MED ORDER — FEXOFENADINE HCL 180 MG PO TABS: 180.0000 mg | ORAL_TABLET | Freq: Every day | ORAL | 0 refills | Status: DC

## 2020-02-26 MED ORDER — CEPHALEXIN 500 MG PO CAPS: 500.0000 mg | ORAL_CAPSULE | Freq: Three times a day (TID) | ORAL | 0 refills | Status: DC

## 2020-02-26 MED ORDER — METHYLPREDNISOLONE ACETATE 40 MG/ML IJ SUSP
40.0000 mg | Freq: Once | INTRAMUSCULAR | Status: AC
  Administered 2020-02-26: 40 mg via INTRAMUSCULAR

## 2020-02-26 NOTE — Progress Notes (Signed)
Toni Wang is a 39 y.o. female with the following history as recorded in [employer]:  Patient Active Problem List   Diagnosis Date Noted  . Acute hand eczema 12/03/2018  . Cellulitis 07/12/2018  . Cervical lymphadenitis 12/18/2017  . Right knee pain 07/08/2016  . Rash 06/03/2015  . Dysuria 04/02/2013  . Other abnormal glucose 04/02/2013  . Routine general medical examination at a health care facility 04/02/2013    Current Outpatient Medications  Medication Sig Dispense Refill  . Multiple Vitamins-Minerals (AIRBORNE PO) Take by mouth daily.    . norethindrone-ethinyl estradiol-iron (TILIA FE) 1-20/1-30/1-35 MG-MCG tablet Take 1 tablet by mouth daily. 3 Package 1  . cephALEXin (KEFLEX) 500 MG capsule Take 1 capsule (500 mg total) by mouth 3 (three) times daily. 15 capsule 0  . famotidine (PEPCID) 20 MG tablet Take 1 tablet (20 mg total) by mouth 2 (two) times daily. 20 tablet 0  . fexofenadine (ALLEGRA ALLERGY) 180 MG tablet Take 1 tablet (180 mg total) by mouth daily. 20 tablet 0   No current facility-administered medications for this visit.    Allergies: Patient has no known allergies.  Past Medical History:  Diagnosis Date  . Allergy   . Heart murmur    as a child  . Irregular menses   . Multiple thyroid nodules    Bilateral    Past Surgical History:  Procedure Laterality Date  . BREAST LUMPECTOMY Left 2001   benign    Family History  Problem Relation Age of Onset  . Hypertension Father   . Cancer Father        throat and lung cancer  . Breast cancer Mother 34  . Diabetes Maternal Grandmother   . Breast cancer Maternal Grandmother 60  . Colon cancer Maternal Grandmother 90       Colon  . Heart disease Maternal Grandfather   . Colon cancer Paternal Grandmother 45       Colon  . Breast cancer Maternal Aunt   . Depression Neg Hx   . Stroke Neg Hx     Social History   Tobacco Use  . Smoking status: Former Smoker    Packs/day: 1.00    Quit date:  11/10/2017    Years since quitting: 2.2  . Smokeless tobacco: Never Used  Substance Use Topics  . Alcohol use: No    Alcohol/week: 0.0 standard drinks    Subjective:  Patient was stung by a bee on Sunday- has noticed that her left foot at site of sting has progressively gotten more red/ swollen; area on top of foot is blistered and weeping; no shortness of breath or difficulty breathing; has been using Benadryl with limited relief.   LMP- 3 weeks/ OCPs but not currently sexually active    Objective:  Vitals:   02/26/20 0854  BP: 138/90  Pulse: (!) 105  Temp: 98.9 F (37.2 C)  TempSrc: Oral  SpO2: 96%  Weight: 164 lb (74.4 kg)    General: Well developed, well nourished, in no acute distress  Skin : Warm and dry. Erythema/ swelling noted on top of left foot/ blister noted between 1st and 2nd toe with clear liquid noted Head: Normocephalic and atraumatic  Lungs: Respirations unlabored;  Musculoskeletal: No deformities; Extremities: No edema, cyanosis, clubbing  Vessels: Symmetric bilaterally  Neurologic: Alert and oriented; speech intact; face symmetrical; moves all extremities well; CNII-XII intact without focal deficit   Assessment:  1. Allergic reaction, initial encounter   2. Cellulitis of toe of  left foot     Plan:  Depo-Medrol IM 40 mg given in office; Rx for Keflex 500 mg tid x 5 days; if symptoms do not respond as hoped to steroid injection, she will start Allegra and Pepcid for antihistamine relief. Follow-up worse, no better.  This visit occurred during the SARS-CoV-2 public health emergency.  Safety protocols were in place, including screening questions prior to the visit, additional usage of staff PPE, and extensive cleaning of exam room while observing appropriate contact time as indicated for disinfecting solutions.     No follow-ups on file.  No orders of the defined types were placed in this encounter.   Requested Prescriptions   Signed Prescriptions Disp  Refills  . cephALEXin (KEFLEX) 500 MG capsule 15 capsule 0    Sig: Take 1 capsule (500 mg total) by mouth 3 (three) times daily.  . famotidine (PEPCID) 20 MG tablet 20 tablet 0    Sig: Take 1 tablet (20 mg total) by mouth 2 (two) times daily.  . fexofenadine (ALLEGRA ALLERGY) 180 MG tablet 20 tablet 0    Sig: Take 1 tablet (180 mg total) by mouth daily.

## 2020-03-10 ENCOUNTER — Telehealth: Payer: BC Managed Care – PPO | Admitting: Nurse Practitioner

## 2020-03-10 DIAGNOSIS — T63461D Toxic effect of venom of wasps, accidental (unintentional), subsequent encounter: Secondary | ICD-10-CM

## 2020-03-10 MED ORDER — PREDNISONE 20 MG PO TABS: ORAL_TABLET | ORAL | 0 refills | Status: DC

## 2020-03-10 NOTE — Progress Notes (Signed)
E Visit for Insect Sting  Thank you for describing the insect sting for [place].  Here is how we plan to help!  A sting that we will treat with a short course of prednisone.  The 2 greatest risks from insect stings are allergic reaction, which can be fatal in some people and infection, which is more common and less serious.  Bees, wasps, yellow jackets, and hornets belong to a class of insects called Hymenoptera.  Most insect stings cause only minor discomfort.  Stings can happen anywhere on the body and can be painful.  Most stings are from honey bees or yellow jackets.  Fire ants can sting multiple times.  The sites of the stings are more likely to become infected.    Based on your information I have: and I have sent in prednisone 40 mg by mouth daily for 5 days to the pharmacy you selected.  Please make sure that you selected a pharmacy that is open now.  What can be used to prevent Insect Stings?   Insect repellant with at least 20% DEET.    Wearing long pants and shirts with socks and shoes.    Wear dark or drab-colored clothes rather than bright colors.    Avoid using perfumes and hair sprays; these attract insects.  HOME CARE ADVICE:  1. Stinger removal:  The stinger looks like a tiny black dot in the sting.  Use a fingernail, credit card edge, or knife-edge to scrape it off.  Don't pull it out because it squeezes out more venom.  If the stinger is below the skin surface, leave it alone.  It will be shed with normal skin healing. 2. Use cold compresses to the area of the sting for 10-20 minutes.  You may repeat this as needed to relieve symptoms of pain and swelling. 3.  For pain relief, take acetominophen 650 mg 4-6 hours as needed or ibuprofen 400 mg every 6-8 hours as needed or naproxen 250-500 mg every 12 hours as needed. 4.  You can also use hydrocortisone cream 0.5% or 1% up to 4 times daily as needed for itching. 5.  If the sting becomes very itchy, take Benadryl  25-50 mg, follow directions on box. 6.  Wash the area 2-3 times daily with antibacterial soap and warm water. 7. Call your Doctor if:  Fever, a severe headache, or rash occur in the next 2 weeks.  Sting area begins to look infected.  Redness and swelling worsens after home treatment.  Your current symptoms become worse.    MAKE SURE YOU:   Understand these instructions.  Will watch your condition.  Will get help right away if you are not doing well or get worse.  Thank you for choosing an e-visit. Your e-visit answers were reviewed by a board certified advanced clinical practitioner to complete your personal care plan. Depending upon the condition, your plan could have included both over the counter or prescription medications. Please review your pharmacy choice. Be sure that the pharmacy you have chosen is open so that you can pick up your prescription now.  If there is a problem you may message your provider in MyChart to have the prescription routed to another pharmacy. Your safety is important to [place]. If you have drug allergies check your prescription carefully.  For the next 24 hours, you can use MyChart to ask questions about today's visit, request a non-urgent call back, or ask for a work or school excuse from your e-visit provider.  You will get an email in the next two days asking about your experience. I hope that your e-visit has been valuable and will speed your recovery.   5-10 minutes spent reviewing and documenting in chart.

## 2020-04-17 ENCOUNTER — Telehealth: Payer: BC Managed Care – PPO | Admitting: Family

## 2020-04-17 DIAGNOSIS — L309 Dermatitis, unspecified: Secondary | ICD-10-CM

## 2020-04-17 MED ORDER — PREDNISONE 20 MG PO TABS
20.0000 mg | ORAL_TABLET | Freq: Every day | ORAL | 0 refills | Status: DC
Start: 2020-04-17 — End: 2020-04-22

## 2020-04-17 NOTE — Progress Notes (Signed)
E-Visit for Eczema  We are sorry that you are not feeling well. Here is how we plan to help! Based on what you shared with me it looks like you have eczema (atopic dermatitis).  Although the cause of eczema is not completely understood, genetics appear to play a strong role, and people with a family history of eczema are at increased risk of developing the condition. In most people with eczema, there is a genetic abnormality in the outermost layer of the skin, called the epidermis   Most people with eczema develop their first symptoms as children, before the age of 73. Intense itching of the skin, patches of redness, small bumps, and skin flaking are common. Scratching can further inflame the skin and worsen the itching. The itchiness may be more noticeable at nighttime.  Eczema commonly affects the back of the neck, the elbow creases, and the backs of the knees. Other affected areas may include the face, wrists, and forearms. The skin may become thickened and darkened, or even scarred, from repeated scratching. Eliminating factors that aggravate your eczema symptoms can help to control the symptoms. Possible triggers may include: ? Cold or dry environments ? Sweating ? Emotional stress or anxiety ? Rapid temperature changes ? Exposure to certain chemicals or cleaning solutions, including soaps and detergents, perfumes and cosmetics, wool or synthetic fibers, dust, sand, and cigarette smoke Keeping your skin hydrated Emollients -- Emollients are creams and ointments that moisturize the skin and prevent it from drying out. The best emollients for people with eczema are thick creams (such as Eucerin, Cetaphil, and Nutraderm) or ointments (such as petroleum jelly, Aquaphor, and Vaseline), which contain little to no water. Emollients are most effective when applied immediately after bathing. Emollients can be applied twice a day or more often if needed. Lotions contain more water than creams and  ointments and are less effective for moisturizing the skin. Bathing -- It is not clear if showers or baths are better for keeping the skin hydrated. Lukewarm baths or showers can hydrate and cool the skin, temporarily relieving itching from eczema. An unscented, mild soap or non-soap cleanser (such as Cetaphil) should be used sparingly. Apply an emollient immediately after bathing or showering to prevent your skin from drying out as a result of water evaporation. Emollient bath additives (products you add to the bath water) have not been found to help relieve symptoms. Hot or long baths (more than 10 to 15 minutes) and showers should be avoided since they can dry out the skin.  Based on what you shared with me you may have eczema.   Triamcinalone ointment (or cream). Apply to the effected areas twice per day. and Prednisone 20 mg tablets. Take one daily by mouth for 5 days    I recommend dilute bleach baths for people with eczema. These baths help to decrease the number of bacteria on the skin that can cause infections or worsen symptoms. To prepare a bleach bath, one-fourth to one-half cup of bleach is placed in a full bathtub (about 40 gallons) of water. Bleach baths are usually taken for 5 to 10 minutes twice per week and should be followed by application of an emollient (listed above). I recommend you take Benadryl 25mg  - 50mg  every 4 hours to control the symptoms (including itching) but if they last over 24 hours it is best that you see an office based provider for follow up.  HOME CARE: . Take lukewarm showers or baths . Apply creams and ointments  to prevent the skin from drying (Eucerin, Cetaphil, Nutraderm, petroleum jelly, Aquaphor or Vaseline) - these products contain less water than other lotions and are more effective for moisturizing the skin . Limit exposure to cold or dry environments, sweating, emotional stress and anxiety, rapid temperature changes and exposure to chemicals and  cleaning products, soaps and detergents, perfumes, cosmetics, wool and synthetic fibers, dust, sand and cigarette- factors which can aggravate eczema symptoms.  . Use a hydrocortisone cream once or twice a day . Take an antihistamine like Benadryl for widespread rashes that itch.  The adult dosage of Benadryl is 25-50 mg by mouth 4 times daily. Caution: This type of medication may cause sleepiness.  Do not drink alcohol, drive, or operate dangerous machinery while taking antihistamines.  Do not take these medications if you have prostate enlargement.  Read the package instructions thoroughly on all medications that you take.  GET HELP RIGHT AWAY IF: . Symptoms that don't go away after treatment. . Severe itching that persists. . You develop a fever. . Your skin begins to drain. . You have a sore throat. . You become short of breath.  MAKE SURE YOU    Understand these instructions.  Will watch your condition.  Will get help right away if you are not doing well or get worse.  Thank you for choosing an e-visit. Your e-visit answers were reviewed by a board certified advanced clinical practitioner to complete your personal care plan. Depending upon the condition, your plan could have included both over the counter or prescription medications. Please review your pharmacy choice. Make sure the pharmacy is open so you can pick up prescription now. If there is a problem, you may contact your provider through [employer] and have the prescription routed to another pharmacy. Your safety is important to [place]. If you have drug allergies check your prescription carefully.  For the next 24 hours you can use MyChart to ask questions about today's visit, request a non-urgent call back, or ask for a work or school excuse. You will get an email in the next two days asking about your experience. I hope that your e-visit has been valuable and will speed your recovery       Greater than 5 minutes,  yet less than 10 minutes of time have been spent researching, coordinating, and implementing care for this patient today.  Thank you for the details you included in the comment boxes. Those details are very helpful in determining the best course of treatment for you and help [place] to provide the best care.

## 2020-04-22 ENCOUNTER — Ambulatory Visit (INDEPENDENT_AMBULATORY_CARE_PROVIDER_SITE_OTHER): Payer: BC Managed Care – PPO | Admitting: Internal Medicine

## 2020-04-22 ENCOUNTER — Encounter: Payer: Self-pay | Admitting: Internal Medicine

## 2020-04-22 ENCOUNTER — Other Ambulatory Visit: Payer: Self-pay

## 2020-04-22 VITALS — BP 150/80 | HR 112 | Temp 98.6°F | Ht 64.57 in | Wt 169.0 lb

## 2020-04-22 DIAGNOSIS — L309 Dermatitis, unspecified: Secondary | ICD-10-CM | POA: Diagnosis not present

## 2020-04-22 MED ORDER — METHYLPREDNISOLONE ACETATE 80 MG/ML IJ SUSP
120.0000 mg | Freq: Once | INTRAMUSCULAR | Status: AC
  Administered 2020-04-22: 120 mg via INTRAMUSCULAR

## 2020-04-22 MED ORDER — TACROLIMUS 0.1 % EX OINT: TOPICAL_OINTMENT | Freq: Two times a day (BID) | CUTANEOUS | 1 refills | Status: DC

## 2020-04-22 MED ORDER — HYDROXYZINE PAMOATE 25 MG PO CAPS: 25.0000 mg | ORAL_CAPSULE | Freq: Three times a day (TID) | ORAL | 1 refills | Status: DC | PRN

## 2020-04-22 NOTE — Progress Notes (Signed)
Subjective:  Patient ID: Toni Wang, female    DOB: [birth year]  Age: 39 y.o. MRN: [id number]  CC: Rash  This visit occurred during the SARS-CoV-2 public health emergency.  Safety protocols were in place, including screening questions prior to the visit, additional usage of staff PPE, and extensive cleaning of exam room while observing appropriate contact time as indicated for disinfecting solutions.    HPI Toni Wang presents for concerns about a 5-day history of pruritic rash on the dorsum of both hands.  She has a history of eczema but she has never had anything like this before.  It sounds like she recently did a virtual visit with someone else for treatment of eczema and briefly took prednisone and then this happened.  She has a topical steroid at home but she has not been using that.  Instead, for the last few days she has been applying CeraVe and taking Benadryl.  She tells me that the Benadryl helps but only briefly.  The CeraVe has not helped.  Outpatient Medications Prior to Visit  Medication Sig Dispense Refill  . Multiple Vitamins-Minerals (AIRBORNE PO) Take by mouth daily.    . norethindrone-ethinyl estradiol-iron (TILIA FE) 1-20/1-30/1-35 MG-MCG tablet Take 1 tablet by mouth daily. 3 Package 1  . cephALEXin (KEFLEX) 500 MG capsule Take 1 capsule (500 mg total) by mouth 3 (three) times daily. 15 capsule 0  . famotidine (PEPCID) 20 MG tablet Take 1 tablet (20 mg total) by mouth 2 (two) times daily. 20 tablet 0  . fexofenadine (ALLEGRA ALLERGY) 180 MG tablet Take 1 tablet (180 mg total) by mouth daily. 20 tablet 0  . predniSONE (DELTASONE) 20 MG tablet Take 1 tablet (20 mg total) by mouth daily with breakfast. 5 tablet 0   No facility-administered medications prior to visit.    ROS Review of Systems  Constitutional: Negative for chills, fatigue and fever.  HENT: Negative for sore throat and trouble swallowing.   Eyes: Negative.   Respiratory:  Negative for cough, chest tightness, shortness of breath and wheezing.   Cardiovascular: Negative for chest pain, palpitations and leg swelling.  Gastrointestinal: Negative for abdominal pain, diarrhea, nausea and vomiting.  Endocrine: Negative.   Genitourinary: Negative.  Negative for difficulty urinating.  Musculoskeletal: Negative for arthralgias, back pain and myalgias.  Skin: Positive for color change and rash.  Neurological: Negative.  Negative for dizziness, weakness and light-headedness.  Hematological: Negative for adenopathy. Does not bruise/bleed easily.  Psychiatric/Behavioral: The patient is nervous/anxious.     Objective:  BP (!) 150/80 (BP Location: Left Arm, Patient Position: Sitting, Cuff Size: Large)   Pulse (!) 112   Temp 98.6 F (37 C) (Oral)   Ht 5' 4.57" (1.64 m)   Wt 169 lb (76.7 kg)   LMP 03/28/2020 (Approximate)   SpO2 98%   BMI 28.50 kg/m   BP Readings from Last 3 Encounters:  04/22/20 (!) 150/80  02/26/20 138/90  02/27/19 120/82    Wt Readings from Last 3 Encounters:  04/22/20 169 lb (76.7 kg)  02/26/20 164 lb (74.4 kg)  02/27/19 160 lb (72.6 kg)    Physical Exam Vitals reviewed.  Constitutional:      Appearance: Normal appearance.  HENT:     Nose: Nose normal.     Mouth/Throat:     Mouth: Mucous membranes are moist.  Eyes:     General: No scleral icterus.    Conjunctiva/sclera: Conjunctivae normal.  Cardiovascular:  Rate and Rhythm: Normal rate and regular rhythm.     Heart sounds: No murmur heard.   Pulmonary:     Effort: Pulmonary effort is normal.     Breath sounds: No stridor. No wheezing, rhonchi or rales.  Abdominal:     General: Abdomen is flat. Bowel sounds are normal. There is no distension.     Palpations: Abdomen is soft. There is no hepatomegaly, splenomegaly or mass.  Musculoskeletal:     Cervical back: Neck supple.  Lymphadenopathy:     Cervical: No cervical adenopathy.  Skin:    Findings: Erythema and rash  present.     Comments: On the dorsum of both hands there are erythematous, blanching papules with some coalescence.  There are no vesicles or pustules.  There is no involvement of the webspaces and no involvement of the sides of the fingers.  See photo.  Neurological:     General: No focal deficit present.     Mental Status: She is alert.  Psychiatric:        Attention and Perception: Attention normal.        Mood and Affect: Mood is anxious.        Behavior: Behavior normal.        Thought Content: Thought content normal.     Lab Results  Component Value Date   WBC 14.0 (H) 12/18/2017   HGB 12.8 12/18/2017   HCT 38.7 12/18/2017   PLT 281.0 12/18/2017   GLUCOSE 111 (H) 12/18/2017   CHOL 234 (H) 01/24/2017   TRIG 181 (H) 01/24/2017   HDL 40 (L) 01/24/2017   LDLDIRECT 131.6 04/02/2013   LDLCALC 158 (H) 01/24/2017   ALT 11 12/18/2017   AST 13 12/18/2017   NA 141 12/18/2017   K 4.1 12/18/2017   CL 105 12/18/2017   CREATININE 0.55 12/18/2017   BUN 6 12/18/2017   CO2 29 12/18/2017   TSH 1.00 01/24/2017   HGBA1C 5.3 04/02/2013    MM 3D SCREEN BREAST BILATERAL  Result Date: 03/20/2018 CLINICAL DATA:  Screening. EXAM: DIGITAL SCREENING BILATERAL MAMMOGRAM WITH TOMO AND CAD COMPARISON:  Previous exam(s). ACR Breast Density Category d: The breast tissue is extremely dense, which lowers the sensitivity of mammography. FINDINGS: There are no findings suspicious for malignancy. Images were processed with CAD. IMPRESSION: No mammographic evidence of malignancy. A result letter of this screening mammogram will be mailed directly to the patient. RECOMMENDATION: Screening mammogram at age 69. (Code:SM-B-40A) BI-RADS CATEGORY  1: Negative. Electronically Signed   By: Amie Portland M.D.   On: 03/20/2018 09:57    Assessment & Plan:   Toni Wang was seen today for rash.  Diagnoses and all orders for this visit:  Acute hand eczema- Her history, symptoms and exam are consistent with acute hand  eczema related to the hyaluronic acid and CeraVe.  I recommended that she treat this with the topical steroid that she has as well as tacrolimus and an injection of methylprednisolone.  Additionally I think she should upgrade to a prescription antihistamine.  Obviously, I recommended that she stop using CeraVe. -     Discontinue: tacrolimus (PROTOPIC) 0.1 % ointment; Apply topically 2 (two) times daily. -     Discontinue: hydrOXYzine (VISTARIL) 25 MG capsule; Take 1 capsule (25 mg total) by mouth every 8 (eight) hours as needed. -     methylPREDNISolone acetate (DEPO-MEDROL) injection 120 mg   I have discontinued Toni Wang's cephALEXin, famotidine, fexofenadine, and predniSONE. I am also having  her maintain her Multiple Vitamins-Minerals (AIRBORNE PO) and Tilia Fe. We administered methylPREDNISolone acetate.  Meds ordered this encounter  Medications  . DISCONTD: tacrolimus (PROTOPIC) 0.1 % ointment    Sig: Apply topically 2 (two) times daily.    Dispense:  100 g    Refill:  1  . DISCONTD: hydrOXYzine (VISTARIL) 25 MG capsule    Sig: Take 1 capsule (25 mg total) by mouth every 8 (eight) hours as needed.    Dispense:  90 capsule    Refill:  1  . methylPREDNISolone acetate (DEPO-MEDROL) injection 120 mg     Follow-up: Return in about 3 weeks (around 05/13/2020).  Sanda Linger, MD

## 2020-04-22 NOTE — Patient Instructions (Signed)
Dyshidrotic Eczema Dyshidrotic eczema (pompholyx) is a type of eczema that causes very itchy (pruritic), fluid-filled blisters (vesicles) to form on the hands and feet. It can affect people of any age, but is more common before the age of 40. There is no cure, but treatment and certain lifestyle changes can help relieve symptoms. What are the causes? The cause of this condition is not known. What increases the risk? You are more likely to develop this condition if:  You wash your hands frequently.  You have a personal history or family history of eczema, allergies, asthma, or hay fever.  You are allergic to metals such as nickel or cobalt.  You work with cement.  You smoke. What are the signs or symptoms? Symptoms of this condition may affect the hands, feet, or both. Symptoms may come and go (recur), and may include:  Severe itching, which may happen before blisters appear.  Blisters. These may form suddenly. ? In the early stages, blisters may form near the fingertips. ? In severe cases, blisters may grow to large blister masses (bullae). ? Blisters resolve in 2-3 weeks without bursting. This is followed by a dry phase in which itching eases.  Pain and swelling.  Cracks or long, narrow openings (fissures) in the skin.  Severe dryness.  Ridges on the nails. How is this diagnosed? This condition may be diagnosed based on:  A physical exam.  Your symptoms.  Your medical history.  Skin scrapings to rule out a fungal infection.  Testing a swab of fluid for bacteria (culture).  Removing and checking a small piece of skin (biopsy) in order to test for infection or to rule out other conditions.  Skin patch tests. These tests involve taking patches that contain possible allergens and placing them on your back. Your health care provider will wait a few days and then check to see if an allergic reaction occurred. These tests may be done if your health care provider suspects  allergic reactions, or to rule out other types of eczema. You may be referred to a health care provider who specializes in the skin (dermatologist) to help diagnose and treat this condition. How is this treated? There is no cure for this condition, but treatment can help relieve symptoms. Depending on how many blisters you have and how severe they are, your health care provider may suggest:  Avoiding allergens, irritants, or triggers that worsen symptoms. This may involve lifestyle changes such as: ? Using different lotions or soaps. ? Avoiding hot weather or places that will cause you to sweat a lot. ? Managing stress with coping techniques such as relaxation and exercise, and asking for help when you need it. ? Diet changes as recommended by your health care provider.  Using a clean, damp towel (cool compress) to relieve symptoms.  Soaking in a bath that contains a type of salt that relieves irritation (aluminum acetate soaks).  Medicine taken by mouth to reduce itching (oral antihistamines).  Medicine applied to the skin to reduce swelling and irritation (topical corticosteroids).  Medicine that reduces the activity of the body's disease-fighting system (immunosuppressants) to treat inflammation. This may be given in severe cases.  Antibiotic medicines to treat bacterial infection.  Light therapy (phototherapy). This involves shining ultraviolet (UV) light on affected skin in order to reduce itchiness and inflammation. Follow these instructions at home: Bathing and skin care   Wash skin gently. After bathing or washing your hands, pat your skin dry. Avoid rubbing your skin.  Remove   all jewelry before bathing. If the skin under the jewelry stays wet, blisters may form or get worse.  Apply cool compresses as told by your health care provider: ? Soak a clean towel in cool water. ? Wring out excess water until towel is damp. ? Place the towel over affected skin. Leave the towel on  for 20 minutes at a time, 2-3 times a day.  Use mild soaps, cleansers, and lotions that do not contain dyes, perfumes, or other irritants.  Keep your skin hydrated. To do this: ? Avoid very hot water. Take lukewarm baths or showers. ? Apply moisturizer within three minutes of bathing. This locks in moisture. Medicines  Take and apply over-the-counter and prescription medicines only as told by your health care provider.  If you were prescribed antibiotic medicine, take or apply it as told by your health care provider. Do not stop using the antibiotic even if you start to feel better. General instructions  Identify and avoid triggers and allergens.  Keep fingernails short to avoid breaking open the skin while scratching.  Use waterproof gloves to protect your hands when doing work that keeps your hands wet for a long time.  Wear socks to keep your feet dry.  Do not use any products that contain nicotine or tobacco, such as cigarettes and e-cigarettes. If you need help quitting, ask your health care provider.  Keep all follow-up visits as told by your health care provider. This is important. Contact a health care provider if:  You have symptoms that do not go away.  You have signs of infection, such as: ? Crusting, pus, or a bad smell. ? More redness, swelling, or pain. ? Increased warmth in the affected area. Summary  Dyshidrotic eczema (pompholyx) is a type of eczema that causes very itchy (pruritic), fluid-filled blisters (vesicles) to form on the hands and feet.  The cause of this condition is not known.  There is no cure for this condition, but treatment can help relieve symptoms. Treatment depends on how many blisters you have and how severe they are.  Use mild soaps, cleansers, and lotions that do not contain dyes, perfumes, or other irritants. Keep your skin hydrated. This information is not intended to replace advice given to you by your health care provider. Make sure  you discuss any questions you have with your health care provider. Document Revised: 12/19/2018 Document Reviewed: 01/12/2017 Elsevier Patient Education  2020 [employer]  

## 2020-04-23 ENCOUNTER — Telehealth: Payer: Self-pay | Admitting: Internal Medicine

## 2020-04-23 DIAGNOSIS — L309 Dermatitis, unspecified: Secondary | ICD-10-CM

## 2020-04-23 MED ORDER — HYDROXYZINE PAMOATE 25 MG PO CAPS: 25.0000 mg | ORAL_CAPSULE | Freq: Three times a day (TID) | ORAL | 1 refills | Status: DC | PRN

## 2020-04-23 MED ORDER — TACROLIMUS 0.1 % EX OINT: TOPICAL_OINTMENT | Freq: Two times a day (BID) | CUTANEOUS | 1 refills | Status: DC

## 2020-04-23 NOTE — Telephone Encounter (Signed)
Erx has been sent as requested.   Shanda Bumps has informed pt of same.

## 2020-04-23 NOTE — Telephone Encounter (Signed)
    Patient doesn't want to use [facility], requesting medication be sent to [facility]  1.Medication Requested: hydrOXYzine (VISTARIL) 25 MG capsule tacrolimus (PROTOPIC) 0.1 % ointment   2. Pharmacy (Name, Street, City):[facility] - [place], [place] - [address]  3. On Med List: yes  4. Last Visit with PCP: 04/22/20  5. Next visit date with PCP: n/a   Agent: Please be advised that RX refills may take up to 3 business days. We ask that you follow-up with your pharmacy.

## 2020-04-24 ENCOUNTER — Telehealth: Payer: Self-pay | Admitting: Internal Medicine

## 2020-04-24 MED ORDER — MOMETASONE FUROATE 0.1 % EX CREA: 1.0000 "application " | TOPICAL_CREAM | Freq: Two times a day (BID) | CUTANEOUS | 0 refills | Status: DC

## 2020-04-24 NOTE — Telephone Encounter (Signed)
I will call in a topical steroid cream. She should go ahead and get on cancellation list at dermatology to try and be seen as soon as possible.

## 2020-04-24 NOTE — Telephone Encounter (Signed)
New Message:   Pt is calling and states she was here on yesterday for a rash and was prescribed some medication. Pt states the medications aren't working and she would like to know is there anything else she can do. Pt also states she called a dermatologist and no one can get her in today. Please advise.

## 2020-04-24 NOTE — Telephone Encounter (Signed)
Spoke with patient today and info given. 

## 2020-05-04 ENCOUNTER — Other Ambulatory Visit: Payer: Self-pay

## 2020-05-04 DIAGNOSIS — Z3041 Encounter for surveillance of contraceptive pills: Secondary | ICD-10-CM

## 2020-05-04 MED ORDER — TILIA FE 1-20/1-30/1-35 MG-MCG PO TABS: 1.0000 | ORAL_TABLET | Freq: Every day | ORAL | 0 refills | Status: DC

## 2020-05-04 NOTE — Telephone Encounter (Signed)
Medication refill request: tilia FE 28S Last AEX:  02/27/19 Next AEX: 06/11/20 Last MMG (if hormonal medication request): NA Refill authorized: 84/0

## 2020-06-01 ENCOUNTER — Other Ambulatory Visit: Payer: Self-pay | Admitting: Internal Medicine

## 2020-06-01 DIAGNOSIS — L309 Dermatitis, unspecified: Secondary | ICD-10-CM

## 2020-06-11 ENCOUNTER — Other Ambulatory Visit: Payer: Self-pay

## 2020-06-11 ENCOUNTER — Encounter: Payer: Self-pay | Admitting: Obstetrics and Gynecology

## 2020-06-11 ENCOUNTER — Ambulatory Visit (INDEPENDENT_AMBULATORY_CARE_PROVIDER_SITE_OTHER): Payer: BC Managed Care – PPO | Admitting: Obstetrics and Gynecology

## 2020-06-11 VITALS — BP 132/78 | HR 96 | Ht 64.5 in | Wt 162.0 lb

## 2020-06-11 DIAGNOSIS — Z01419 Encounter for gynecological examination (general) (routine) without abnormal findings: Secondary | ICD-10-CM | POA: Diagnosis not present

## 2020-06-11 DIAGNOSIS — Z3041 Encounter for surveillance of contraceptive pills: Secondary | ICD-10-CM | POA: Diagnosis not present

## 2020-06-11 DIAGNOSIS — Z803 Family history of malignant neoplasm of breast: Secondary | ICD-10-CM

## 2020-06-11 NOTE — Patient Instructions (Signed)

## 2020-06-11 NOTE — Progress Notes (Signed)
39 y.o. G0P0 Legally Separated White or Caucasian Not Hispanic or Latino female here for annual exam.   Period Cycle (Days): 28 Period Duration (Days): 3-4 Period Pattern: Regular Menstrual Flow: Moderate Menstrual Control: Tampon, Maxi pad Menstrual Control Change Freq (Hours): 4 Dysmenorrhea: (!) Moderate Dysmenorrhea Symptoms: Headache, Diarrhea, Cramping  Patient's last menstrual period was 05/26/2020.          Sexually active: No.  The current method of family planning is OCP (estrogen/progesterone).    Exercising: No.  The patient does not participate in regular exercise at present. Smoker:  no  Health Maintenance: Pap:  02/07/2018 WNL NEG HPV, 12-04-14 WNL NEG HR HPV History of abnormal Pap:  no MMG:  03/19/18 density D Bi-rads 1 neg  BMD:   Never  Colonoscopy: never  TDaP:  02/27/19 Gardasil:  No, has been counseled     reports that she quit smoking about 2 years ago. She smoked 1.00 pack per day. She has never used smokeless tobacco. She reports previous drug use. She reports that she does not drink alcohol. Works as an Research scientist (medical), looking to change jobs.  Past Medical History:  Diagnosis Date  . Allergy   . Heart murmur    as a child  . Irregular menses   . Multiple thyroid nodules    Bilateral    Past Surgical History:  Procedure Laterality Date  . BREAST LUMPECTOMY Left 2001   benign    Current Outpatient Medications  Medication Sig Dispense Refill  . Multiple Vitamins-Minerals (AIRBORNE PO) Take by mouth daily.    . norethindrone-ethinyl estradiol-iron (TILIA FE) 1-20/1-30/1-35 MG-MCG tablet Take 1 tablet by mouth daily. 84 tablet 0   No current facility-administered medications for this visit.    Family History  Problem Relation Age of Onset  . Hypertension Father   . Cancer Father        throat and lung cancer  . Breast cancer Mother 62  . Diabetes Maternal Grandmother   . Breast cancer Maternal Grandmother 60  . Colon cancer Maternal  Grandmother 90       Colon  . Heart disease Maternal Grandfather   . Colon cancer Paternal Grandmother 27       Colon  . Breast cancer Maternal Aunt   . Depression Neg Hx   . Stroke Neg Hx     Review of Systems  All other systems reviewed and are negative.   Exam:   BP 132/78   Pulse 96   Ht 5' 4.5" (1.638 m)   Wt 162 lb (73.5 kg)   LMP 05/26/2020   SpO2 100%   BMI 27.38 kg/m   Weight change: @WEIGHTCHANGE @ Height:   Height: 5' 4.5" (163.8 cm)  Ht Readings from Last 3 Encounters:  06/11/20 5' 4.5" (1.638 m)  04/22/20 5' 4.57" (1.64 m)  02/27/19 5' 4.57" (1.64 m)    General appearance: alert, cooperative and appears stated age Head: Normocephalic, without obvious abnormality, atraumatic Neck: no adenopathy, supple, symmetrical, trachea midline and thyroid normal to inspection and palpation Lungs: clear to auscultation bilaterally Cardiovascular: regular rate and rhythm Breasts: normal appearance, no masses or tenderness Abdomen: soft, non-tender; non distended,  no masses,  no organomegaly Extremities: extremities normal, atraumatic, no cyanosis or edema Skin: Skin color, texture, turgor normal. No rashes or lesions Lymph nodes: Cervical, supraclavicular, and axillary nodes normal. No abnormal inguinal nodes palpated Neurologic: Grossly normal   Pelvic: External genitalia:  no lesions  Urethra:  normal appearing urethra with no masses, tenderness or lesions              Bartholins and Skenes: normal                 Vagina: normal appearing vagina with normal color and discharge, no lesions              Cervix: no lesions               Bimanual Exam:  Uterus:  normal size, contour, position, consistency, mobility, non-tender              Adnexa: no mass, fullness, tenderness               Rectovaginal: Confirms               Anus:  normal sphincter tone, no lesions  Zenovia Jordan. chaperoned for the exam.  A:  Well Woman with normal exam  FH of  breast cancer, has declined genetic counseling  P:   No pap this year  She will schedule her mammogram, understands the importance   Continue OCP's  Discussed breast self exam  Discussed calcium and vit D intake  Declines gardasil  Declines screening labs.

## 2020-06-17 ENCOUNTER — Telehealth: Payer: Self-pay

## 2020-06-17 ENCOUNTER — Encounter: Payer: Self-pay | Admitting: Obstetrics and Gynecology

## 2020-06-17 ENCOUNTER — Other Ambulatory Visit: Payer: Self-pay | Admitting: Obstetrics and Gynecology

## 2020-06-17 DIAGNOSIS — Z3041 Encounter for surveillance of contraceptive pills: Secondary | ICD-10-CM

## 2020-06-17 MED ORDER — TILIA FE 1-20/1-30/1-35 MG-MCG PO TABS: 1.0000 | ORAL_TABLET | Freq: Every day | ORAL | 3 refills | Status: DC

## 2020-06-17 NOTE — Telephone Encounter (Signed)
Script and my chart message sent.

## 2020-06-17 NOTE — Telephone Encounter (Signed)
Pt sent following mychart message:  Toni Wang, Toni Wang [facility] Clinical Pool Has a new Rx for Tilia Fe been sent into the [facility] on [address]? I haven't received any notification from them and I can't get through whenever I call. I'm not in any rush (I still have a month's supply); I'm just checking where things stand.   Thank you,  Gilman Buttner

## 2020-06-17 NOTE — Telephone Encounter (Signed)
Rx Tila FE sent on 05/04/20 #84, 79F.  AEX 06/11/20, per notes:  A:         Well Woman with normal exam             FH of breast cancer, has declined genetic counseling  P:         No pap this year             She will schedule her mammogram, understands the importance              Continue OCP's    Routing to Dr Oscar La, please review and advise

## 2021-02-01 ENCOUNTER — Telehealth: Payer: BC Managed Care – PPO | Admitting: Physician Assistant

## 2021-02-01 ENCOUNTER — Encounter: Payer: Self-pay | Admitting: Nurse Practitioner

## 2021-02-01 DIAGNOSIS — L259 Unspecified contact dermatitis, unspecified cause: Secondary | ICD-10-CM | POA: Diagnosis not present

## 2021-02-01 MED ORDER — PREDNISONE 10 MG PO TABS: ORAL_TABLET | ORAL | 0 refills | Status: DC

## 2021-02-01 MED ORDER — DOXYCYCLINE HYCLATE 100 MG PO CAPS: 100.0000 mg | ORAL_CAPSULE | Freq: Two times a day (BID) | ORAL | 0 refills | Status: DC

## 2021-02-01 NOTE — Progress Notes (Signed)
E-Visit for [employer]  We are sorry that you are not feeing well.  Here is how we plan to help!  I understand that your risk for poison ivy is low however this rash does look like a contact dermatitis of some sort.  Additionally it appears that it may have started to get infected therefore I am going to prescribe both prednisone and an antibiotic as discussed below.  Based on what you have shared with me it looks like you have had an allergic reaction to the oily resin from a group of plants.  This resin is very sticky, so it easily attaches to your skin, clothing, tools equipment, and pet's fur.    This blistering rash is often called poison ivy rash although it can come from contact with the leaves, stems and roots of poison ivy, poison oak and poison sumac.  The oily resin contains urushiol (u-ROO-she-ol) that produces a skin rash on exposed skin.  The severity of the rash depends on the amount of urushiol that gets on your skin.  A section of skin with more urushiol on it may develop a rash sooner.  The rash usually develops 12-48 hours after exposure and can last two to three weeks.  Your skin must come in direct contact with the plant's oil to be affected.  Blister fluid doesn't spread the rash.  However, if you come into contact with a piece of clothing or pet fur that has urushiol on it, the rash may spread out.  You can also transfer the oil to other parts of your body with your fingers.  Often the rash looks like a straight line because of the way the plant brushes against your skin.  I suspect that a bacterial infection has developed at the rash site, and I have given you both an oral steroid and an oral antibiotic.  I have sent a prednisone dose pack to your chosen pharmacy. Be sure to follow the instructions carefully and complete the entire prescription.  You may use Benadryl or Caladryl topical lotions to sooth the itch and remember cool, not hot, showers and baths can help relieve the  itching!  I have also sent in a prescription for an antibiotic: doxycycline 100 mg twice a day for 10 days.   Please schedule an appointment with your primary care provider to follow up on your skin infection within 2 weeks.  If drainage and red streaking continue to spread you should be seen urgently.  Make sure that the clothes you were wearing and any towels or sheets that may have come in contact with the oil (urushiol) are washed in detergent and hot water.    I have developed the following plan to treat your condition I am prescribing a two week course of steroids (37 tablets of 10 mg prednisone).  Days 1-4 take 4 tablets (40 mg) daily  Days 5-8 take 3 tablets (30 mg) daily, Days 9-11 take 2 tablets (20 mg) daily, Days 12-14 take 1 tablet (10 mg) daily.  and I am prescribing Doxycycline 100 mg twice per day with food for ten days (20 tablets)   What can you do to prevent this rash?  Avoid the plants.  Learn how to identify poison ivy, poison oak and poison sumac in all seasons.  When hiking or engaging in other activities that might expose you to these plants, try to stay on cleared pathways.  If camping, make sure you pitch your tent in an area free  of these plants.  Keep pets from running through wooded areas so that urushiol doesn't accidentally stick to their fur, which you may touch.  Remove or kill the plants.  In your yard, you can get rid of poison ivy by applying an herbicide or pulling it out of the ground, including the roots, while wearing heavy gloves.  Afterward remove the gloves and thoroughly wash them and your hands.  Don't burn poison ivy or related plants because the urushiol can be carried by smoke.  Wear protective clothing.  If needed, protect your skin by wearing socks, boots, pants, long sleeves and vinyl gloves.  Wash your skin right away.  Washing off the oil with soap and water within 30 minutes of exposure may reduce your chances of getting a poison ivy rash.  Even  washing after an hour or so can help reduce the severity of the rash.  If you walk through some poison ivy and then later touch your shoes, you may get some urushiol on your hands, which may then transfer to your face or body by touching or rubbing.  If the contaminated object isn't cleaned, the urushiol on it can still cause a skin reaction years later.    Be careful not to reuse towels after you have washed your skin.  Also carefully wash clothing in detergent and hot water to remove all traces of the oil.  Handle contaminated clothing carefully so you don't transfer the urushiol to yourself, furniture, rugs or appliances.  Remember that pets can carry the oil on their fur and paws.  If you think your pet may be contaminated with urushiol, put on some long rubber gloves and give your pet a bath.  Finally, be careful not to burn these plants as the smoke can contain traces of the oil.  Inhaling the smoke may result in difficulty breathing. If that occurred you should see a physician as soon as possible.  See your doctor right away if:   The reaction is severe or widespread  You inhaled the smoke from burning poison ivy and are having difficulty breathing  Your skin continues to swell  The rash affects your eyes, mouth or genitals  Blisters are oozing pus  You develop a fever greater than 100 F (37.8 C)  The rash doesn't get better within a few weeks.  If you scratch the poison ivy rash, bacteria under your fingernails may cause the skin to become infected.  See your doctor if pus starts oozing from the blisters.  Treatment generally includes antibiotics.  Poison ivy treatments are usually limited to self-care methods.  And the rash typically goes away on its own in two to three weeks.     If the rash is widespread or results in a large number of blisters, your doctor may prescribe an oral corticosteroid, such as prednisone.  If a bacterial infection has developed at the rash site,  your doctor may give you a prescription for an oral antibiotic.  MAKE SURE YOU   Understand these instructions.  Will watch your condition.  Will get help right away if you are not doing well or get worse.  Thank you for choosing an e-visit. Your e-visit answers were reviewed by a board certified advanced clinical practitioner to complete your personal care plan. Depending upon the condition, your plan could have included both over the counter or prescription medications.  Please review your pharmacy choice. If there is a problem you may use MyChart messaging to have  the prescription routed to another pharmacy.   Your safety is important to [place]. If you have drug allergies check your prescription carefully.  You can use MyChart to ask questions about today's visit, request a non-urgent call back, or ask for a work or school excuse for 24 hours related to this e-Visit. If it has been greater than 24 hours you will need to follow up with your provider, or enter a new e-Visit to address those concerns.   You will get an email in the next two days asking about your experience. I hope that your e-visit has been valuable and will speed your recovery Thank you for choosing an e-visit.     Greater than 5 minutes, yet less than 10 minutes of time have been spent researching, coordinating, and implementing care for this patient today

## 2021-02-04 ENCOUNTER — Encounter: Payer: Self-pay | Admitting: Internal Medicine

## 2021-02-04 ENCOUNTER — Telehealth: Payer: BC Managed Care – PPO | Admitting: Orthopedic Surgery

## 2021-02-04 DIAGNOSIS — M546 Pain in thoracic spine: Secondary | ICD-10-CM

## 2021-02-04 NOTE — Progress Notes (Signed)
We are sorry that you are not feeling well.  Here is how we plan to help!  Based on what you have shared with me it looks like you mostly have acute back pain.  Acute back pain is defined as musculoskeletal pain that can resolve in 1-3 weeks with conservative treatment.  I can find no reports of the pain you described being caused by doxycycline or prednisone. Given the short duration of the pain I do not see any reason why you need to stop taking the medicine. If it gets worse or doesn't stop on its own please reach out to [place] again.   Home Care  Stay active.  Start with short walks on flat ground if you can.  Try to walk farther each day.  Do not sit, drive or stand in one place for more than 30 minutes.  Do not stay in bed.  Do not avoid exercise or work.  Activity can help your back heal faster.  Be careful when you bend or lift an object.  Bend at your knees, keep the object close to you, and do not twist.  Sleep on a firm mattress.  Lie on your side, and bend your knees.  If you lie on your back, put a pillow under your knees.  Only take medicines as told by your doctor.  Put ice on the injured area.  Put ice in a plastic bag  Place a towel between your skin and the bag  Leave the ice on for 15-20 minutes, 3-4 times a day for the first 2-3 days. 210 After that, you can switch between ice and heat packs.  Ask your doctor about back exercises or massage.  Avoid feeling anxious or stressed.  Find good ways to deal with stress, such as exercise.  Get Help Right Way If:  Your pain does not go away with rest or medicine.  Your pain does not go away in 1 week.  You have new problems.  You do not feel well.  The pain spreads into your legs.  You cannot control when you poop (bowel movement) or pee (urinate)  You feel sick to your stomach (nauseous) or throw up (vomit)  You have belly (abdominal) pain.  You feel like you may pass out (faint).  If you develop a  fever.  Make Sure you:  Understand these instructions.  Will watch your condition  Will get help right away if you are not doing well or get worse.  Your e-visit answers were reviewed by a board certified advanced clinical practitioner to complete your personal care plan.  Depending on the condition, your plan could have included both over the counter or prescription medications.  If there is a problem please reply  once you have received a response from your provider.  Your safety is important to [place].  If you have drug allergies check your prescription carefully.    You can use MyChart to ask questions about today's visit, request a non-urgent call back, or ask for a work or school excuse for 24 hours related to this e-Visit. If it has been greater than 24 hours you will need to follow up with your provider, or enter a new e-Visit to address those concerns.  You will get an e-mail in the next two days asking about your experience.  I hope that your e-visit has been valuable and will speed your recovery. Thank you for using e-visits.  Greater than 5 minutes, yet less than 10  minutes of time have been spent researching, coordinating and implementing care for this patient today.

## 2021-02-15 ENCOUNTER — Telehealth: Payer: BC Managed Care – PPO | Admitting: Emergency Medicine

## 2021-02-15 DIAGNOSIS — R21 Rash and other nonspecific skin eruption: Secondary | ICD-10-CM

## 2021-02-15 MED ORDER — NYSTATIN 100000 UNIT/GM EX CREA: 1.0000 "application " | TOPICAL_CREAM | Freq: Two times a day (BID) | CUTANEOUS | 0 refills | Status: AC

## 2021-02-15 NOTE — Progress Notes (Signed)
E Visit for Rash  We are sorry that you are not feeling well. Here is how we plan to help!  It looks like you were already on an extended 2 week dose of prednisone.  I would avoid another course of oral prednisone at this time.  The rash could have a fungal component causing the continued itching.  If it does not improve with the medication provided today, I would recommend in-person evaluation, preferably with your primary care provider or a dermatologist in case testing is needed.  Based upon your presentation it appears you have a fungal infection.  I have prescribed: and Nystatin cream apply to the affected area twice daily   HOME CARE:   Take cool showers and avoid direct sunlight.  Apply cool compress or wet dressings.  Take a bath in an oatmeal bath.  Sprinkle content of one Aveeno packet under running faucet with comfortably warm water.  Bathe for 15-20 minutes, 1-2 times daily.  Pat dry with a towel. Do not rub the rash.  Use hydrocortisone cream.  Take an antihistamine like Benadryl for widespread rashes that itch.  The adult dose of Benadryl is 25-50 mg by mouth 4 times daily.  Caution:  This type of medication may cause sleepiness.  Do not drink alcohol, drive, or operate dangerous machinery while taking antihistamines.  Do not take these medications if you have prostate enlargement.  Read package instructions thoroughly on all medications that you take.  GET HELP RIGHT AWAY IF:   Symptoms don't go away after treatment.  Severe itching that persists.  If you rash spreads or swells.  If you rash begins to smell.  If it blisters and opens or develops a yellow-brown crust.  You develop a fever.  You have a sore throat.  You become short of breath.  MAKE SURE YOU:  Understand these instructions. Will watch your condition. Will get help right away if you are not doing well or get worse.  Thank you for choosing an e-visit. Your e-visit answers were reviewed by  a board certified advanced clinical practitioner to complete your personal care plan. Depending upon the condition, your plan could have included both over the counter or prescription medications. Please review your pharmacy choice. Be sure that the pharmacy you have chosen is open so that you can pick up your prescription now.  If there is a problem you may message your provider in MyChart to have the prescription routed to another pharmacy. Your safety is important to [place]. If you have drug allergies check your prescription carefully.  For the next 24 hours, you can use MyChart to ask questions about today's visit, request a non-urgent call back, or ask for a work or school excuse from your e-visit provider. You will get an email in the next two days asking about your experience. I hope that your e-visit has been valuable and will speed your recovery.    Greater than 5 minutes, yet less than 10 minutes of time have been spent researching, coordinating, and implementing care for this patient today.

## 2021-02-18 ENCOUNTER — Encounter: Payer: Self-pay | Admitting: Family Medicine

## 2021-02-18 ENCOUNTER — Telehealth (INDEPENDENT_AMBULATORY_CARE_PROVIDER_SITE_OTHER): Payer: BC Managed Care – PPO | Admitting: Family Medicine

## 2021-02-18 DIAGNOSIS — R21 Rash and other nonspecific skin eruption: Secondary | ICD-10-CM | POA: Diagnosis not present

## 2021-02-18 MED ORDER — TRIAMCINOLONE ACETONIDE 0.1 % EX CREA: 1.0000 "application " | TOPICAL_CREAM | Freq: Two times a day (BID) | CUTANEOUS | 0 refills | Status: DC

## 2021-02-18 NOTE — Patient Instructions (Signed)
-  I sent the medication(s) we discussed to your pharmacy: Meds ordered this encounter  Medications   triamcinolone cream (KENALOG) 0.1 %    Sig: Apply 1 application topically 2 (two) times daily.    Dispense:  45 g    Refill:  0   Shot, cooler showers, less frequent and do not scrub or use harsh soaps - only hypoallergenic soaps and laundry detergent.  Apply Aquaphor and the trimacinilone cream twice daily  Wear natural fiber clothing if possible.  Eat a healthy diet and avoid processed foods, excessive dairy, sweets, simple starches and meat.  Drink plenty of water.   Schedule a follow up visit with dermatology or with your primary care office in 1-2 weeks.   I hope you are feeling better soon!  Seek in person care promptly if your symptoms worsen, new concerns arise or you are not improving with treatment.  It was nice to meet you today. I help  out with telemedicine visits on Tuesdays and Thursdays and am available for visits on those days. If you have any concerns or questions following this visit please schedule a follow up visit with your Primary Care doctor or seek care at a local urgent care clinic to avoid delays in care.

## 2021-02-18 NOTE — Progress Notes (Signed)
Virtual Visit via Video Note  I connected with Toni Wang  on 02/18/21 at  6:00 PM EDT by a video enabled telemedicine application and verified that I am speaking with the correct person using two identifiers.  Location patient: home, [place] Location provider:work or home office Persons participating in the virtual visit: patient, provider  I discussed the limitations of evaluation and management by telemedicine and the availability of in person appointments. The patient expressed understanding and agreed to proceed.   HPI:  Acute telemedicine visit for Rash: -Onset: about 3 weeks ago -she did and evisit and they prescribe a 2 week course of prednisone and antibiotic as was oozing and weeping -reports this happened in the past with a bee sting and required several course of prednisone -Symptoms include:very itchy rash, flexor surfaces of both legs, itchy red plaque that is a little dry scaly and raised -Denies:rash elsewhere, malaise, illness otherwise,  -Has tried: OTC cortisone, otc cream, benadryl -Pertinent past medical history: eczema, contact dermatitis, psoriasis, has issues like this often every summer when around animals -Pertinent medication allergies:No Known Allergies  ROS: See pertinent positives and negatives per HPI.  Past Medical History:  Diagnosis Date   Allergy    Heart murmur    as a child   Irregular menses    Multiple thyroid nodules    Bilateral    Past Surgical History:  Procedure Laterality Date   BREAST LUMPECTOMY Left 2001   benign     Current Outpatient Medications:    Multiple Vitamins-Minerals (AIRBORNE PO), Take by mouth daily., Disp: , Rfl:    norethindrone-ethinyl estradiol-iron (TILIA FE) 1-20/1-30/1-35 MG-MCG tablet, Take 1 tablet by mouth daily., Disp: 84 tablet, Rfl: 3   nystatin cream (MYCOSTATIN), Apply 1 application topically 2 (two) times daily for 14 days., Disp: 30 g, Rfl: 0   triamcinolone cream (KENALOG) 0.1 %, Apply 1 application  topically 2 (two) times daily., Disp: 45 g, Rfl: 0  EXAM:  VITALS per patient if applicable:  GENERAL: alert, oriented, appears well and in no acute distress  HEENT: atraumatic, conjunttiva clear, no obvious abnormalities on inspection of external nose and ears  NECK: normal movements of the head and neck  LUNGS: on inspection no signs of respiratory distress, breathing rate appears normal, no obvious gross SOB, gasping or wheezing  CV: no obvious cyanosis  MS: moves all visible extremities without noticeable abnormality  SKIN erythematous plaques with ? Scale bilater flexor surfaces of knees  PSYCH/NEURO: pleasant and cooperative, no obvious depression or anxiety, speech and thought processing grossly intact  ASSESSMENT AND PLAN:  Discussed the following assessment and plan:  Skin rash  -we discussed possible serious and likely etiologies, options for evaluation and workup, limitations of telemedicine visit vs in person visit, treatment, treatment risks and precautions. Pt prefers to treat via telemedicine empirically rather than in person at this moment. Query eczema, psoriasis vs other. Opted for topical emollient with steroid cream, zyrtec once daily, gentle skin care regimen, also advised healthy diet. Advise 1-2 week follow up with PCP or dermatology - she agrees to schedule.  Advised to seek prompt in person care if worsening, new symptoms arise, or if is not improving with treatment. Discussed options for inperson care if PCP office not available. Did let this patient know that I only do telemedicine on Tuesdays and Thursdays for [place]. Advised to schedule follow up visit with PCP or [facility] if any further questions or concerns to avoid delays in care.   I  discussed the assessment and treatment plan with the patient. The patient was provided an opportunity to ask questions and all were answered. The patient agreed with the plan and demonstrated an understanding of the  instructions.     Terressa Koyanagi, DO

## 2021-03-05 ENCOUNTER — Ambulatory Visit: Payer: BC Managed Care – PPO | Admitting: Internal Medicine

## 2021-03-12 ENCOUNTER — Telehealth: Payer: BC Managed Care – PPO | Admitting: Physician Assistant

## 2021-03-12 DIAGNOSIS — R21 Rash and other nonspecific skin eruption: Secondary | ICD-10-CM

## 2021-03-12 MED ORDER — TRIAMCINOLONE ACETONIDE 0.1 % EX CREA: 1.0000 "application " | TOPICAL_CREAM | Freq: Two times a day (BID) | CUTANEOUS | 0 refills | Status: DC

## 2021-03-12 NOTE — Progress Notes (Signed)
E Visit for Rash  We are sorry that you are not feeling well. Here is how we plan to help!  Based on what you shared with me it looks like you have contact dermatitis.  Contact dermatitis is a skin rash caused by something that touches the skin and causes irritation or inflammation.  Your skin may be red, swollen, dry, cracked, and itch.  The rash should go away in a few days but can last a few weeks.  If you get a rash, it's important to figure out what caused it so the irritant can be avoided in the future.  Based on what you have shared with me today, it looks like the best option is to follow up with the specialist as already in process. I am happy to refill your medication to get you to that appt.  HOME CARE:  Take cool showers and avoid direct sunlight. Apply cool compress or wet dressings. Take a bath in an oatmeal bath.  Sprinkle content of one Aveeno packet under running faucet with comfortably warm water.  Bathe for 15-20 minutes, 1-2 times daily.  Pat dry with a towel. Do not rub the rash. Use hydrocortisone cream. Take an antihistamine like Benadryl for widespread rashes that itch.  The adult dose of Benadryl is 25-50 mg by mouth 4 times daily. Caution:  This type of medication may cause sleepiness.  Do not drink alcohol, drive, or operate dangerous machinery while taking antihistamines.  Do not take these medications if you have prostate enlargement.  Read package instructions thoroughly on all medications that you take.  GET HELP RIGHT AWAY IF:  Symptoms don't go away after treatment. Severe itching that persists. If you rash spreads or swells. If you rash begins to smell. If it blisters and opens or develops a yellow-brown crust. You develop a fever. You have a sore throat. You become short of breath.  MAKE SURE YOU:  Understand these instructions. Will watch your condition. Will get help right away if you are not doing well or get worse.  Thank you for choosing an  e-visit.  Your e-visit answers were reviewed by a board certified advanced clinical practitioner to complete your personal care plan. Depending upon the condition, your plan could have included both over the counter or prescription medications.  Please review your pharmacy choice. Make sure the pharmacy is open so you can pick up prescription now. If there is a problem, you may contact your provider through [employer] and have the prescription routed to another pharmacy.  Your safety is important to [place]. If you have drug allergies check your prescription carefully.   For the next 24 hours you can use MyChart to ask questions about today's visit, request a non-urgent call back, or ask for a work or school excuse. You will get an email in the next two days asking about your experience. I hope that your e-visit has been valuable and will speed your recovery.   I provided 5 minutes of non face-to-face time during this encounter for chart review and documentation.

## 2021-06-15 ENCOUNTER — Other Ambulatory Visit: Payer: Self-pay | Admitting: *Deleted

## 2021-06-15 ENCOUNTER — Other Ambulatory Visit: Payer: Self-pay | Admitting: Obstetrics and Gynecology

## 2021-06-15 DIAGNOSIS — Z3041 Encounter for surveillance of contraceptive pills: Secondary | ICD-10-CM

## 2021-06-15 MED ORDER — NORETHINDRON-ETHINYL ESTRAD-FE 1-20/1-30/1-35 MG-MCG PO TABS: 1.0000 | ORAL_TABLET | Freq: Every day | ORAL | 0 refills | Status: DC

## 2021-07-07 NOTE — Progress Notes (Signed)
40 y.o. G0P0 Legally Separated White or Caucasian Not Hispanic or Latino female here for annual exam.  She is on OCP's for acne. In the last 6 months her skin has been worse Period Cycle (Days): 28 Period Duration (Days): 4 Period Pattern: Regular Menstrual Flow: Moderate Dysmenorrhea: (!) Mild Dysmenorrhea Symptoms: Cramping  Patient's last menstrual period was 06/23/2021.          Sexually active: No.  The current method of family planning is OCP (estrogen/progesterone).    Exercising: Yes.    Home exercise routine includes running, weights. Smoker:  no  Health Maintenance: Pap:  02-07-18 Normal Neg HR HPV History of abnormal Pap:  no MMG:  03-19-18 normal BMD:   N/A Colonoscopy: N/A TDaP:  2020 Gardasil: No   reports that she quit smoking about 3 years ago. Her smoking use included cigarettes. She smoked an average of 1 pack per day. She has never used smokeless tobacco. She reports current alcohol use. She reports that she does not currently use drugs. Works for a [employer] development [employer].   Past Medical History:  Diagnosis Date   Allergy    Heart murmur    as a child   Irregular menses    Multiple thyroid nodules    Bilateral    Past Surgical History:  Procedure Laterality Date   BREAST LUMPECTOMY Left 2001   benign    Current Outpatient Medications  Medication Sig Dispense Refill   Multiple Vitamins-Minerals (AIRBORNE PO) Take by mouth daily.     TILIA FE 1-20/1-30/1-35 MG-MCG tablet TAKE 1 TABLET BY MOUTH EVERY DAY 28 tablet 0   triamcinolone cream (KENALOG) 0.1 % Apply 1 application topically 2 (two) times daily. (Patient not taking: Reported on 07/16/2021) 90 g 0   No current facility-administered medications for this visit.    Family History  Problem Relation Age of Onset   Hypertension Father    Cancer Father        throat and lung cancer   Breast cancer Mother 51   Diabetes Maternal Grandmother    Breast cancer Maternal Grandmother 71   Colon cancer Maternal  Grandmother 38       Colon   Heart disease Maternal Grandfather    Colon cancer Paternal Grandmother 63       Colon   Breast cancer Maternal Aunt    Depression Neg Hx    Stroke Neg Hx     Review of Systems  All other systems reviewed and are negative.  Exam:   BP (!) 146/98 (BP Location: Right Arm, Cuff Size: Normal) Comment: Re checked from initial BP  Pulse 95   Ht 5' 4.5" (1.638 m)   Wt 170 lb (77.1 kg)   LMP 06/23/2021   SpO2 99%   BMI 28.73 kg/m   Weight change: @WEIGHTCHANGE @ Height:   Height: 5' 4.5" (163.8 cm)  Ht Readings from Last 3 Encounters:  07/16/21 5' 4.5" (1.638 m)  06/11/20 5' 4.5" (1.638 m)  04/22/20 5' 4.57" (1.64 m)    General appearance: alert, cooperative and appears stated age Head: Normocephalic, without obvious abnormality, atraumatic Neck: no adenopathy, supple, symmetrical, trachea midline and thyroid normal to inspection and palpation Lungs: clear to auscultation bilaterally Cardiovascular: regular rate and rhythm Breasts: normal appearance, no masses or tenderness Abdomen: soft, non-tender; non distended,  no masses,  no organomegaly Extremities: extremities normal, atraumatic, no cyanosis or edema Skin: Skin color, texture, turgor normal. No rashes or lesions Lymph nodes: Cervical, supraclavicular, and axillary nodes  normal. No abnormal inguinal nodes palpated Neurologic: Grossly normal   Pelvic: External genitalia:  no lesions              Urethra:  normal appearing urethra with no masses, tenderness or lesions              Bartholins and Skenes: normal                 Vagina: normal appearing vagina with normal color and discharge, no lesions              Cervix: no lesions               Bimanual Exam:  Uterus:  normal size, contour, position, consistency, mobility, non-tender              Adnexa: no mass, fullness, tenderness               Rectovaginal: Confirms               Anus:  normal sphincter tone, no lesions  Kennon Portela,  CMA chaperoned for the exam.  1. Well woman exam Discussed breast self exam Discussed calcium and vit D intake No pap this year She will schedule her Mammogram, overdue  2. Encounter for surveillance of contraceptive pills Patient not currently sexually active, has an increased risk of breast cancer, her acne that was previously controlled on OCP's has worsened and her BP is up today.Discussed coming off of OCP's. She will stop OCP's Calendar her cycle Reach out if her cycle is problematic or if she needs contraception  3. Family history of breast cancer Declines referral to genetics  4. Elevated BP without diagnosis of hypertension Repeat BP 150/100 Stop OCP's She should have a f/u BP check a few weeks after going off of OCP's

## 2021-07-13 ENCOUNTER — Other Ambulatory Visit: Payer: Self-pay | Admitting: Obstetrics and Gynecology

## 2021-07-13 DIAGNOSIS — Z3041 Encounter for surveillance of contraceptive pills: Secondary | ICD-10-CM

## 2021-07-13 NOTE — Telephone Encounter (Signed)
Medication refill request: Tilia  Last AEX:  06-11-20 JJ  Next AEX: 07-16-21  Last MMG (if hormonal medication request): 03-19-18 density D/BIRADS 1 negative  Refill authorized: Today, please advise.   Medication pended for #28, 0RF. Please refill if appropriate.

## 2021-07-16 ENCOUNTER — Ambulatory Visit (INDEPENDENT_AMBULATORY_CARE_PROVIDER_SITE_OTHER): Payer: BC Managed Care – PPO | Admitting: Obstetrics and Gynecology

## 2021-07-16 ENCOUNTER — Encounter: Payer: Self-pay | Admitting: Obstetrics and Gynecology

## 2021-07-16 ENCOUNTER — Other Ambulatory Visit: Payer: Self-pay

## 2021-07-16 VITALS — BP 150/100 | HR 95 | Ht 64.5 in | Wt 170.0 lb

## 2021-07-16 DIAGNOSIS — Z3041 Encounter for surveillance of contraceptive pills: Secondary | ICD-10-CM

## 2021-07-16 DIAGNOSIS — R030 Elevated blood-pressure reading, without diagnosis of hypertension: Secondary | ICD-10-CM

## 2021-07-16 DIAGNOSIS — Z803 Family history of malignant neoplasm of breast: Secondary | ICD-10-CM

## 2021-07-16 DIAGNOSIS — Z01419 Encounter for gynecological examination (general) (routine) without abnormal findings: Secondary | ICD-10-CM | POA: Diagnosis not present

## 2021-07-16 NOTE — Patient Instructions (Addendum)
You should have your blood pressure checked a few weeks after going off of OCP's   EXERCISE   We recommended that you start or continue a regular exercise program for good health. Physical activity is anything that gets your body moving, some is better than none. The CDC recommends 150 minutes per week of Moderate-Intensity Aerobic Activity and 2 or more days of Muscle Strengthening Activity.  Benefits of exercise are limitless: helps weight loss/weight maintenance, improves mood and energy, helps with depression and anxiety, improves sleep, tones and strengthens muscles, improves balance, improves bone density, protects from chronic conditions such as heart disease, high blood pressure and diabetes and so much more. To learn more visit: http://kirby-bean.org/  DIET: Good nutrition starts with a healthy diet of fruits, vegetables, whole grains, and lean protein sources. Drink plenty of water for hydration. Minimize empty calories, sodium, sweets. For more information about dietary recommendations visit: CriticalGas.be and https://www.carpenter-henry.info/  ALCOHOL:  Women should limit their alcohol intake to no more than 7 drinks/beers/glasses of wine (combined, not each!) per week. Moderation of alcohol intake to this level decreases your risk of breast cancer and liver damage.  If you are concerned that you may have a problem, or your friends have told you they are concerned about your drinking, there are many resources to help. A well-known program that is free, effective, and available to all people all over the nation is Alcoholics Anonymous.  Check out this site to learn more: BeverageBargains.co.za   CALCIUM AND VITAMIN D:  Adequate intake of calcium and Vitamin D are recommended for bone health.  You should be getting between 1000-1200 mg of calcium and 800 units of Vitamin D daily between diet and supplements  PAP  SMEARS:  Pap smears, to check for cervical cancer or precancers,  have traditionally been done yearly, scientific advances have shown that most women can have pap smears less often.  However, every woman still should have a physical exam from her gynecologist every year. It will include a breast check, inspection of the vulva and vagina to check for abnormal growths or skin changes, a visual exam of the cervix, and then an exam to evaluate the size and shape of the uterus and ovaries. We will also provide age appropriate advice regarding health maintenance, like when you should have certain vaccines, screening for sexually transmitted diseases, bone density testing, colonoscopy, mammograms, etc.   MAMMOGRAMS:  All women over 70 years old should have a routine mammogram.   COLON CANCER SCREENING: Now recommend starting at age 34. At this time colonoscopy is not covered for routine screening until 50. There are take home tests that can be done between 45-49.   COLONOSCOPY:  Colonoscopy to screen for colon cancer is recommended for all women at age 15.  We know, you hate the idea of the prep.  We agree, BUT, having colon cancer and not knowing it is worse!!  Colon cancer so often starts as a polyp that can be seen and removed at colonscopy, which can quite literally save your life!  And if your first colonoscopy is normal and you have no family history of colon cancer, most women don't have to have it again for 10 years.  Once every ten years, you can do something that may end up saving your life, right?  We will be happy to help you get it scheduled when you are ready.  Be sure to check your insurance coverage so you understand how much it will  cost.  It may be covered as a preventative service at no cost, but you should check your particular policy.      Breast Self-Awareness Breast self-awareness means being familiar with how your breasts look and feel. It involves checking your breasts regularly and  reporting any changes to your health care provider. Practicing breast self-awareness is important. A change in your breasts can be a sign of a serious medical problem. Being familiar with how your breasts look and feel allows you to find any problems early, when treatment is more likely to be successful. All women should practice breast self-awareness, including women who have had breast implants. How to do a breast self-exam One way to learn what is normal for your breasts and whether your breasts are changing is to do a breast self-exam. To do a breast self-exam: Look for Changes  Remove all the clothing above your waist. Stand in front of a mirror in a room with good lighting. Put your hands on your hips. Push your hands firmly downward. Compare your breasts in the mirror. Look for differences between them (asymmetry), such as: Differences in shape. Differences in size. Puckers, dips, and bumps in one breast and not the other. Look at each breast for changes in your skin, such as: Redness. Scaly areas. Look for changes in your nipples, such as: Discharge. Bleeding. Dimpling. Redness. A change in position. Feel for Changes Carefully feel your breasts for lumps and changes. It is best to do this while lying on your back on the floor and again while sitting or standing in the shower or tub with soapy water on your skin. Feel each breast in the following way: Place the arm on the side of the breast you are examining above your head. Feel your breast with the other hand. Start in the nipple area and make  inch (2 cm) overlapping circles to feel your breast. Use the pads of your three middle fingers to do this. Apply light pressure, then medium pressure, then firm pressure. The light pressure will allow you to feel the tissue closest to the skin. The medium pressure will allow you to feel the tissue that is a little deeper. The firm pressure will allow you to feel the tissue close to the  ribs. Continue the overlapping circles, moving downward over the breast until you feel your ribs below your breast. Move one finger-width toward the center of the body. Continue to use the  inch (2 cm) overlapping circles to feel your breast as you move slowly up toward your collarbone. Continue the up and down exam using all three pressures until you reach your armpit.  Write Down What You Find  Write down what is normal for each breast and any changes that you find. Keep a written record with breast changes or normal findings for each breast. By writing this information down, you do not need to depend only on memory for size, tenderness, or location. Write down where you are in your menstrual cycle, if you are still menstruating. If you are having trouble noticing differences in your breasts, do not get discouraged. With time you will become more familiar with the variations in your breasts and more comfortable with the exam. How often should I examine my breasts? Examine your breasts every month. If you are breastfeeding, the best time to examine your breasts is after a feeding or after using a breast pump. If you menstruate, the best time to examine your breasts is 5-7 days  after your period is over. During your period, your breasts are lumpier, and it may be more difficult to notice changes. When should I see my health care provider? See your health care provider if you notice: A change in shape or size of your breasts or nipples. A change in the skin of your breast or nipples, such as a reddened or scaly area. Unusual discharge from your nipples. A lump or thick area that was not there before. Pain in your breasts. Anything that concerns you.

## 2021-08-01 ENCOUNTER — Other Ambulatory Visit: Payer: Self-pay | Admitting: Obstetrics and Gynecology

## 2021-08-01 DIAGNOSIS — Z3041 Encounter for surveillance of contraceptive pills: Secondary | ICD-10-CM

## 2021-09-11 ENCOUNTER — Telehealth: Payer: BC Managed Care – PPO | Admitting: Physician Assistant

## 2021-09-11 DIAGNOSIS — U071 COVID-19: Secondary | ICD-10-CM

## 2021-09-11 NOTE — Progress Notes (Signed)
Virtual Visit Consent   Toni Wang, you are scheduled for a virtual visit with a [facility] provider today.     Just as with appointments in the office, your consent must be obtained to participate.  Your consent will be active for this visit and any virtual visit you may have with one of our providers in the next 365 days.     If you have a MyChart account, a copy of this consent can be sent to you electronically.  All virtual visits are billed to your insurance company just like a traditional visit in the office.    As this is a virtual visit, video technology does not allow for your provider to perform a traditional examination.  This may limit your provider's ability to fully assess your condition.  If your provider identifies any concerns that need to be evaluated in person or the need to arrange testing (such as labs, EKG, etc.), we will make arrangements to do so.     Although advances in technology are sophisticated, we cannot ensure that it will always work on either your end or our end.  If the connection with a video visit is poor, the visit may have to be switched to a telephone visit.  With either a video or telephone visit, we are not always able to ensure that we have a secure connection.     I need to obtain your verbal consent now.   Are you willing to proceed with your visit today?    [name] has provided verbal consent on 09/11/2021 for a virtual visit (video or telephone).   Toni Wang, [place]   Date: 09/11/2021 10:45 AM   Virtual Visit via Video Note   I, Toni Wang, connected with  Cassondra Stachowski  ([id number], August 17, 1981) on 09/11/21 at 10:15 AM EST by a video-enabled telemedicine application and verified that I am speaking with the correct person using two identifiers.  Location: Patient: Virtual Visit Location Patient: Home Provider: Virtual Visit Location Provider: Home Office   I discussed the limitations of  evaluation and management by telemedicine and the availability of in person appointments. The patient expressed understanding and agreed to proceed.    History of Present Illness: Toni Wang is a 40 y.o. who identifies as a female who was assigned female at birth, and is being seen today for COVID-19. Notes symptoms starting during the day on Wednesday with mild cold symptoms. Took COVID tests Wednesday and Thursday that were both negative. Finally took another test last night which did come back positive.  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  Resting comfortably at home.  Head is normocephalic, atraumatic.  No labored breathing.  Speech is clear and coherent with logical content.  Patient is alert and oriented at baseline.   Assessment and Plan: 1. COVID-19  Milder symptoms. Lower risk of complications -  risk score of 0. No indication for antiviral at present time as risk of ADR from medication > likely benefit. Supportive measures, OTC medications and Vitamin regimen reviewed. Patient enrolled in COVID monitoring program through MyChart. Strict ER precautions reviewed.   Follow Up Instructions: I discussed the assessment and treatment plan with the patient. The patient was provided an opportunity to ask questions and all were answered. The  patient agreed with the plan and demonstrated an understanding of the instructions.  A copy of instructions were sent to the patient via MyChart unless otherwise noted below.   The patient was advised to call back or seek an in-person evaluation if the symptoms worsen or if the condition fails to improve as anticipated.  Time:  I spent 10 minutes with the patient via telehealth technology discussing the above problems/concerns.    Toni Climes, PA-C

## 2021-09-11 NOTE — Patient Instructions (Signed)
Toni Wang, thank you for joining [name], PA-C for today's virtual visit.  While this provider is not your primary care provider (PCP), if your PCP is located in our provider database this encounter information will be shared with them immediately following your visit.  Consent: (Patient) Toni Wang provided verbal consent for this virtual visit at the beginning of the encounter.  Current Medications:  Current Outpatient Medications:    Multiple Vitamins-Minerals (AIRBORNE PO), Take by mouth daily., Disp: , Rfl:    TILIA FE 1-20/1-30/1-35 MG-MCG tablet, TAKE 1 TABLET BY MOUTH EVERY DAY, Disp: 28 tablet, Rfl: 0   triamcinolone cream (KENALOG) 0.1 %, Apply 1 application topically 2 (two) times daily. (Patient not taking: Reported on 07/16/2021), Disp: 90 g, Rfl: 0   Medications ordered in this encounter:  No orders of the defined types were placed in this encounter.    *If you need refills on other medications prior to your next appointment, please contact your pharmacy*  Follow-Up: Call back or seek an in-person evaluation if the symptoms worsen or if the condition fails to improve as anticipated.  Other Instructions Please keep well-hydrated and get plenty of rest. Start a saline nasal rinse to flush out your nasal passages. You can use plain Mucinex to help thin congestion. If you have a humidifier, running in the bedroom at night. I want you to start OTC vitamin D3 1000 units daily, vitamin C 1000 mg daily, and a zinc supplement. Please take prescribed medications as directed.  You have been enrolled in a MyChart symptom monitoring program. Please answer these questions daily so we can keep track of how you are doing.  You were to quarantine for 5 days from onset of your symptoms.  After day 5, if you have had no fever and you are feeling better, you can end quarantine but need to mask for an additional 5 days. After day 5 if you have a fever  or are having significant symptoms, please quarantine for full 10 days.  If you note any worsening of symptoms, any significant shortness of breath or any chest pain, please seek ER evaluation ASAP.  Please do not delay care!  COVID-19: What to Do if You Are Sick If you test positive and are an older adult or someone who is at high risk of getting very sick from COVID-19, treatment may be available. Contact a healthcare provider right away after a positive test to determine if you are eligible, even if your symptoms are mild right now. You can also visit a Test to Treat location and, if eligible, receive a prescription from a provider. Don't delay: Treatment must be started within the first few days to be effective. If you have a fever, cough, or other symptoms, you might have COVID-19. Most people have mild illness and are able to recover at home. If you are sick: Keep track of your symptoms. If you have an emergency warning sign (including trouble breathing), call 911. Steps to help prevent the spread of COVID-19 if you are sick If you are sick with COVID-19 or think you might have COVID-19, follow the steps below to care for yourself and to help protect other people in your home and community. Stay home except to get medical care Stay home. Most people with COVID-19 have mild illness and can recover at home without medical care. Do not leave your home, except to get medical care. Do not visit public areas and do not go to  places where you are unable to wear a mask. Take care of yourself. Get rest and stay hydrated. Take over-the-counter medicines, such as acetaminophen, to help you feel better. Stay in touch with your doctor. Call before you get medical care. Be sure to get care if you have trouble breathing, or have any other emergency warning signs, or if you think it is an emergency. Avoid public transportation, ride-sharing, or taxis if possible. Get tested If you have symptoms of COVID-19,  get tested. While waiting for test results, stay away from others, including staying apart from those living in your household. Get tested as soon as possible after your symptoms start. Treatments may be available for people with COVID-19 who are at risk for becoming very sick. Don't delay: Treatment must be started early to be effective--some treatments must begin within 5 days of your first symptoms. Contact your healthcare provider right away if your test result is positive to determine if you are eligible. Self-tests are one of several options for testing for the virus that causes COVID-19 and may be more convenient than laboratory-based tests and point-of-care tests. Ask your healthcare provider or your local health department if you need help interpreting your test results. You can visit your state, tribal, local, and territorial health department's website to look for the latest local information on testing sites. Separate yourself from other people As much as possible, stay in a specific room and away from other people and pets in your home. If possible, you should use a separate bathroom. If you need to be around other people or animals in or outside of the home, wear a well-fitting mask. Tell your close contacts that they may have been exposed to COVID-19. An infected person can spread COVID-19 starting 48 hours (or 2 days) before the person has any symptoms or tests positive. By letting your close contacts know they may have been exposed to COVID-19, you are helping to protect everyone. See COVID-19 and Animals if you have questions about pets. If you are diagnosed with COVID-19, someone from the health department may call you. Answer the call to slow the spread. Monitor your symptoms Symptoms of COVID-19 include fever, cough, or other symptoms. Follow care instructions from your healthcare provider and local health department. Your local health authorities may give instructions on checking  your symptoms and reporting information. When to seek emergency medical attention Look for emergency warning signs* for COVID-19. If someone is showing any of these signs, seek emergency medical care immediately: Trouble breathing Persistent pain or pressure in the chest New confusion Inability to wake or stay awake Pale, gray, or blue-colored skin, lips, or nail beds, depending on skin tone *This list is not all possible symptoms. Please call your medical provider for any other symptoms that are severe or concerning to you. Call 911 or call ahead to your local emergency facility: Notify the operator that you are seeking care for someone who has or may have COVID-19. Call ahead before visiting your doctor Call ahead. Many medical visits for routine care are being postponed or done by phone or telemedicine. If you have a medical appointment that cannot be postponed, call your doctor's office, and tell them you have or may have COVID-19. This will help the office protect themselves and other patients. If you are sick, wear a well-fitting mask You should wear a mask if you must be around other people or animals, including pets (even at home). Wear a mask with the best fit, protection,  and comfort for you. You don't need to wear the mask if you are alone. If you can't put on a mask (because of trouble breathing, for example), cover your coughs and sneezes in some other way. Try to stay at least 6 feet away from other people. This will help protect the people around you. Masks should not be placed on young children under age 35 years, anyone who has trouble breathing, or anyone who is not able to remove the mask without help. Cover your coughs and sneezes Cover your mouth and nose with a tissue when you cough or sneeze. Throw away used tissues in a lined trash can. Immediately wash your hands with soap and water for at least 20 seconds. If soap and water are not available, clean your hands with an  alcohol-based hand sanitizer that contains at least 60% alcohol. Clean your hands often Wash your hands often with soap and water for at least 20 seconds. This is especially important after blowing your nose, coughing, or sneezing; going to the bathroom; and before eating or preparing food. Use hand sanitizer if soap and water are not available. Use an alcohol-based hand sanitizer with at least 60% alcohol, covering all surfaces of your hands and rubbing them together until they feel dry. Soap and water are the best option, especially if hands are visibly dirty. Avoid touching your eyes, nose, and mouth with unwashed hands. Handwashing Tips Avoid sharing personal household items Do not share dishes, drinking glasses, cups, eating utensils, towels, or bedding with other people in your home. Wash these items thoroughly after using them with soap and water or put in the dishwasher. Clean surfaces in your home regularly Clean and disinfect high-touch surfaces (for example, doorknobs, tables, handles, light switches, and countertops) in your "sick room" and bathroom. In shared spaces, you should clean and disinfect surfaces and items after each use by the person who is ill. If you are sick and cannot clean, a caregiver or other person should only clean and disinfect the area around you (such as your bedroom and bathroom) on an as needed basis. Your caregiver/other person should wait as long as possible (at least several hours) and wear a mask before entering, cleaning, and disinfecting shared spaces that you use. Clean and disinfect areas that may have blood, stool, or body fluids on them. Use household cleaners and disinfectants. Clean visible dirty surfaces with household cleaners containing soap or detergent. Then, use a household disinfectant. Use a product from [employer] List N: Disinfectants for Coronavirus (COVID-19). Be sure to follow the instructions on the label to ensure safe and effective use of  the product. Many products recommend keeping the surface wet with a disinfectant for a certain period of time (look at "contact time" on the product label). You may also need to wear personal protective equipment, such as gloves, depending on the directions on the product label. Immediately after disinfecting, wash your hands with soap and water for 20 seconds. For completed guidance on cleaning and disinfecting your home, visit Complete Disinfection Guidance. Take steps to improve ventilation at home Improve ventilation (air flow) at home to help prevent from spreading COVID-19 to other people in your household. Clear out COVID-19 virus particles in the air by opening windows, using air filters, and turning on fans in your home. Use this interactive tool to learn how to improve air flow in your home. When you can be around others after being sick with COVID-19 Deciding when you can be around  others is different for different situations. Find out when you can safely end home isolation. For any additional questions about your care, contact your healthcare provider or state or local health department. 12/01/2020 Content source: [facility] for Immun[facility] and Res[facility] Diseases ([facility]), Division of Viral Diseases This information is not intended to replace advice given to you by your health care provider. Make sure you discuss any questions you have with your health care provider. Document Revised: 01/14/2021 Document Reviewed: 01/14/2021 Elsevier Patient Education  2022 [employer].       If you have been instructed to have an in-person evaluation today at a local Urgent Care facility, please use the link below. It will take you to a list of all of our available [facility], including address, phone number and hours of operation. Please do not delay care.  [facility]  If you or a family member do not have a primary care provider, use the link below to  schedule a visit and establish care. When you choose a [facility] primary care physician or advanced practice provider, you gain a long-term partner in health. Find a Primary Care Provider  Learn more about [facility] in-office and virtual care options:  - Get Care Now

## 2022-01-28 ENCOUNTER — Other Ambulatory Visit: Payer: Self-pay | Admitting: Obstetrics and Gynecology

## 2022-01-28 DIAGNOSIS — Z1231 Encounter for screening mammogram for malignant neoplasm of breast: Secondary | ICD-10-CM

## 2022-02-21 ENCOUNTER — Ambulatory Visit: Payer: BC Managed Care – PPO

## 2022-02-24 ENCOUNTER — Ambulatory Visit
Admission: RE | Admit: 2022-02-24 | Discharge: 2022-02-24 | Disposition: A | Discharge status: Home / Self Care | Payer: BC Managed Care – PPO | Source: Ambulatory Visit | Attending: Obstetrics and Gynecology | Admitting: Obstetrics and Gynecology

## 2022-02-24 DIAGNOSIS — Z1231 Encounter for screening mammogram for malignant neoplasm of breast: Secondary | ICD-10-CM

## 2022-02-25 ENCOUNTER — Ambulatory Visit: Payer: BC Managed Care – PPO

## 2022-11-17 ENCOUNTER — Ambulatory Visit: Payer: BC Managed Care – PPO | Admitting: Internal Medicine

## 2022-11-25 ENCOUNTER — Ambulatory Visit (INDEPENDENT_AMBULATORY_CARE_PROVIDER_SITE_OTHER): Payer: BC Managed Care – PPO | Admitting: Internal Medicine

## 2022-11-25 ENCOUNTER — Encounter: Payer: Self-pay | Admitting: Internal Medicine

## 2022-11-25 VITALS — BP 126/76 | HR 76 | Temp 98.2°F | Ht 64.5 in | Wt 159.0 lb

## 2022-11-25 DIAGNOSIS — Z1231 Encounter for screening mammogram for malignant neoplasm of breast: Secondary | ICD-10-CM

## 2022-11-25 DIAGNOSIS — R200 Anesthesia of skin: Secondary | ICD-10-CM

## 2022-11-25 LAB — CBC WITH DIFFERENTIAL/PLATELET
Basophils Absolute: 0 10*3/uL (ref 0.0–0.1)
Basophils Relative: 0.5 % (ref 0.0–3.0)
Eosinophils Absolute: 0 10*3/uL (ref 0.0–0.7)
Eosinophils Relative: 0.5 % (ref 0.0–5.0)
HCT: 42.8 % (ref 36.0–46.0)
Hemoglobin: 14.5 g/dL (ref 12.0–15.0)
Lymphocytes Relative: 29.9 % (ref 12.0–46.0)
Lymphs Abs: 2.1 10*3/uL (ref 0.7–4.0)
MCHC: 33.8 g/dL (ref 30.0–36.0)
MCV: 90.8 fl (ref 78.0–100.0)
Monocytes Absolute: 0.3 10*3/uL (ref 0.1–1.0)
Monocytes Relative: 4.6 % (ref 3.0–12.0)
Neutro Abs: 4.6 10*3/uL (ref 1.4–7.7)
Neutrophils Relative %: 64.5 % (ref 43.0–77.0)
Platelets: 381 10*3/uL (ref 150.0–400.0)
RBC: 4.71 Mil/uL (ref 3.87–5.11)
RDW: 12.4 % (ref 11.5–15.5)
WBC: 7.2 10*3/uL (ref 4.0–10.5)

## 2022-11-25 LAB — COMPREHENSIVE METABOLIC PANEL
ALT: 15 U/L (ref 0–35)
AST: 15 U/L (ref 0–37)
Albumin: 4.6 g/dL (ref 3.5–5.2)
Alkaline Phosphatase: 50 U/L (ref 39–117)
BUN: 10 mg/dL (ref 6–23)
CO2: 28 mEq/L (ref 19–32)
Calcium: 9.8 mg/dL (ref 8.4–10.5)
Chloride: 101 mEq/L (ref 96–112)
Creatinine, Ser: 0.58 mg/dL (ref 0.40–1.20)
GFR: 112.55 mL/min (ref >60.00)
Glucose, Bld: 91 mg/dL (ref 70–99)
Potassium: 4.8 mEq/L (ref 3.5–5.1)
Sodium: 140 mEq/L (ref 135–145)
Total Bilirubin: 0.5 mg/dL (ref 0.2–1.2)
Total Protein: 7.8 g/dL (ref 6.0–8.3)

## 2022-11-25 LAB — VITAMIN B12: Vitamin B-12: 350 pg/mL (ref 211–911)

## 2022-11-25 LAB — HEMOGLOBIN A1C: Hgb A1c MFr Bld: 5.1 % (ref 4.6–6.5)

## 2022-11-25 LAB — TSH: TSH: 0.95 u[IU]/mL (ref 0.35–5.50)

## 2022-11-25 NOTE — Patient Instructions (Addendum)
      Blood work was ordered.   The lab is on the first floor.    Medications changes include :   none    A referral was ordered for neurology.     Someone will call you to schedule an appointment.

## 2022-11-25 NOTE — Progress Notes (Signed)
    Subjective:    Patient ID: Toni Wang, female    DOB: 1980/11/07, 42 y.o.   MRN: :[id number]      HPI Toni Wang is here for  Chief Complaint  Patient presents with   Numbness    Dull numbness in both hands and feet (happens mostly at work when she is in certain position)     Numbness in hand and feet- states dull numbness - no tingling.  It is in her hands and feet.  It occurs mostly when arms and legs are bent - sitting at her desk at work or when her arms are bent for a period of time.  Does not happen at home or walking. Occasionally gets it in sleep. Typically goes away easily - shakes it out.  No weakness.  No back, neck issues.  Hand tingling is usually just ulnar distribution.  Feet tingling is lateral aspect of her foot and toes.  The numbness is very subtle.  She denies decreased sensation.    Medications and allergies reviewed with patient and updated if appropriate.  Current Outpatient Medications on File Prior to Visit  Medication Sig Dispense Refill   fexofenadine (ALLEGRA ODT) 30 MG disintegrating tablet Take 30 mg by mouth daily.     Multiple Vitamins-Minerals (AIRBORNE PO) Take by mouth daily.     No current facility-administered medications on file prior to visit.    Review of Systems  Constitutional:  Negative for chills, fatigue and fever.  Respiratory:  Negative for cough, shortness of breath and wheezing.   Cardiovascular:  Negative for chest pain, palpitations and leg swelling.  Musculoskeletal:  Positive for arthralgias (knees - chronic no change).  Neurological:  Positive for numbness. Negative for dizziness, weakness, light-headedness and headaches.       Objective:   Vitals:   11/25/22 0750  BP: 126/76  Pulse: 76  Temp: 98.2 F (36.8 C)  SpO2: 98%   BP Readings from Last 3 Encounters:  11/25/22 126/76  07/16/21 (!) 150/100  06/11/20 132/78   Wt Readings from Last 3 Encounters:  11/25/22 159 lb (72.1 kg)  07/16/21 170  lb (77.1 kg)  06/11/20 162 lb (73.5 kg)   Body mass index is 26.87 kg/m.    Physical Exam Constitutional:      Appearance: Normal appearance. She is not ill-appearing or toxic-appearing.  HENT:     Head: Normocephalic and atraumatic.  Eyes:     Conjunctiva/sclera: Conjunctivae normal.  Cardiovascular:     Pulses: Normal pulses.  Musculoskeletal:        General: No swelling, tenderness or deformity.     Right lower leg: No edema.     Left lower leg: No edema.  Skin:    General: Skin is warm and dry.     Findings: No rash.  Neurological:     Mental Status: She is alert.     Cranial Nerves: No cranial nerve deficit.     Sensory: No sensory deficit.     Motor: No weakness.     Gait: Gait normal.  Psychiatric:        Mood and Affect: Mood normal.            Assessment & Plan:    Numbness-hands and feet: New Uncertain of cause Will check blood work including CBC, CMP, TSH, ANA, A1c Referral to neurology

## 2022-11-28 ENCOUNTER — Encounter: Payer: Self-pay | Admitting: Neurology

## 2022-12-07 LAB — ANA: Anti Nuclear Antibody (ANA): POSITIVE — AB

## 2022-12-07 LAB — ANTI-NUCLEAR AB-TITER (ANA TITER): ANA Titer 1: 1:40 {titer} — ABNORMAL HIGH

## 2022-12-19 ENCOUNTER — Ambulatory Visit (INDEPENDENT_AMBULATORY_CARE_PROVIDER_SITE_OTHER): Payer: BC Managed Care – PPO | Admitting: Neurology

## 2022-12-19 ENCOUNTER — Encounter: Payer: Self-pay | Admitting: Neurology

## 2022-12-19 VITALS — BP 150/85 | HR 90 | Ht 64.5 in | Wt 160.0 lb

## 2022-12-19 DIAGNOSIS — R202 Paresthesia of skin: Secondary | ICD-10-CM | POA: Diagnosis not present

## 2022-12-19 NOTE — Progress Notes (Signed)
[facility] Clinic Note - Initial Visit   Date: 12/19/2022   Toni Wang MRN: [id number] DOB: 1981-03-25   Dear Dr. Lawerance Bach:  Thank you for your kind referral of Toni Wang for consultation of numbness. Although her history is well known to you, please allow [place] to reiterate it for the purpose of our medical record. The patient was accompanied to the clinic by self.    Toni Wang is a 42 y.o. right-handed female presenting for evaluation of numbness of the hands and feet.   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  Strategies to minimize nerve compromise was discussed.  To be complete, she will return for NCS/EMG of the right arm and leg.  For back pain, PT was offered, she will think about it.    Further recommendations pending results.   ------------------------------------------------------------- History of present illness: Starting around February 2024, she began noticing that her hands would get numb especially if she was sitting at the her desk.  She tends to keep her right arm extended at the shoulder and bent at the elbow.  Symptoms are slightly worse on the right side.  She also has mild back discomfort and sharp pain which may be contributed by her arm positioning.   She has noticed numbness in the lower legs and feet, which occurs when she is sitting flexed at the knees.   It does not occur when active, walking, or standing.  It occurs every day when she is at work.  Symptoms generally improve within a minute if she repositions or stretches. Over the last several weeks, numbness in the arms are less apparent, but she continues to have it in the feet.    No weakness, imbalance, or falls.    Out-side paper records, electronic medical record, and images have been reviewed where available  and summarized as:  Lab Results  Component Value Date   HGBA1C 5.1 11/25/2022   Lab Results  Component Value Date   VITAMINB12 350 11/25/2022   Lab Results  Component Value Date   TSH 0.95 11/25/2022   Lab Results  Component Value Date   ESRSEDRATE 11 12/18/2017    Past Medical History:  Diagnosis Date   Allergy    Heart murmur    as a child   Irregular menses    Multiple thyroid nodules    Bilateral    Past Surgical History:  Procedure Laterality Date   BREAST LUMPECTOMY Left 2001   benign     Medications:  Outpatient Encounter Medications as of 12/19/2022  Medication Sig   diphenhydrAMINE (BENADRYL) 25 mg capsule Take 25 mg by mouth every 6 (six) hours as needed.   fexofenadine (ALLEGRA ODT) 30 MG disintegrating tablet Take 30 mg by mouth daily.   Multiple Vitamins-Minerals (AIRBORNE PO) Take by mouth daily.   No facility-administered encounter medications on file as of 12/19/2022.    Allergies: No Known Allergies  Family History: Family History  Problem Relation Age of Onset   Breast cancer Mother 73   Hypertension Father    Cancer Father        throat and lung cancer   Polycythemia Father    Breast cancer Maternal Aunt    Diabetes Maternal Grandmother    Breast cancer Maternal Grandmother 35   Colon cancer Maternal Grandmother 29       Colon   Heart  disease Maternal Grandfather    Colon cancer Paternal Grandmother 71       Colon   Depression Neg Hx    Stroke Neg Hx     Social History: Social History   Tobacco Use   Smoking status: Former    Packs/day: 1    Types: Cigarettes    Quit date: 11/10/2017    Years since quitting: 5.1   Smokeless tobacco: Never  Vaping Use   Vaping Use: Never used  Substance Use Topics   Alcohol use: Yes    Comment: rarely once a year   Drug use: Yes    Types: Marijuana    Comment: Occasional Edibles   Social History   Social History Narrative   Right Handed    Lives in a second floor apartment     Vital Signs:  BP (!) 150/85   Pulse 90   Ht 5' 4.5" (1.638 m)   Wt 160 lb (72.6 kg)   SpO2 98%   BMI 27.04 kg/m   Neurological Exam: MENTAL STATUS including orientation to time, place, person, recent and remote memory, attention span and concentration, language, and fund of knowledge is normal.  Speech is not dysarthric.  CRANIAL NERVES: II:  No visual field defects.     III-IV-VI: Pupils equal round and reactive to light.  Normal conjugate, extra-ocular eye movements in all directions of gaze.  No nystagmus.  No ptosis.   V:  Normal facial sensation.    VII:  Normal facial symmetry and movements.   VIII:  Normal hearing and vestibular function.   IX-X:  Normal palatal movement.   XI:  Normal shoulder shrug and head rotation.   XII:  Normal tongue strength and range of motion, no deviation or fasciculation.  MOTOR: Motor strength is 5/5 throughout.  No atrophy, fasciculations or abnormal movements.  No pronator drift.   MSRs:                                           Right        Left brachioradialis 2+  2+  biceps 2+  2+  triceps 2+  2+  patellar 2+  2+  ankle jerk 2+  2+  Hoffman no  no  plantar response down  down   SENSORY:  Normal and symmetric perception of light touch, pinprick, vibration, and proprioception.  Romberg's sign absent.   COORDINATION/GAIT: Normal finger-to- nose-finger.  Intact rapid alternating movements bilaterally.  Gait narrow based and stable. Tandem and stressed gait intact.     Thank you for allowing me to participate in patient's care.  If I can answer any additional questions, I would be pleased to do so.    Sincerely,    Collen Vincent K. Allena Katz, DO

## 2022-12-19 NOTE — Patient Instructions (Signed)
Try to avoid bending your elbow and leaning on elbows  Try to avoid crossing the legs  Nerve testing of the right arm and leg  ELECTROMYOGRAM AND NERVE CONDUCTION STUDIES (EMG/NCS) INSTRUCTIONS  How to Prepare The neurologist conducting the EMG will need to know if you have certain medical conditions. Tell the neurologist and other EMG lab personnel if you: Have a pacemaker or any other electrical medical device Take blood-thinning medications Have hemophilia, a blood-clotting disorder that causes prolonged bleeding Bathing Take a shower or bath shortly before your exam in order to remove oils from your skin. Don't apply lotions or creams before the exam.  What to Expect You'll likely be asked to change into a hospital gown for the procedure and lie down on an examination table. The following explanations can help you understand what will happen during the exam.  Electrodes. The neurologist or a technician places surface electrodes at various locations on your skin depending on where you're experiencing symptoms. Or the neurologist may insert needle electrodes at different sites depending on your symptoms.  Sensations. The electrodes will at times transmit a tiny electrical current that you may feel as a twinge or spasm. The needle electrode may cause discomfort or pain that usually ends shortly after the needle is removed. If you are concerned about discomfort or pain, you may want to talk to the neurologist about taking a short break during the exam.  Instructions. During the needle EMG, the neurologist will assess whether there is any spontaneous electrical activity when the muscle is at rest - activity that isn't present in healthy muscle tissue - and the degree of activity when you slightly contract the muscle.  He or she will give you instructions on resting and contracting a muscle at appropriate times. Depending on what muscles and nerves the neurologist is examining, he or she may ask  you to change positions during the exam.  After your EMG You may experience some temporary, minor bruising where the needle electrode was inserted into your muscle. This bruising should fade within several days. If it persists, contact your primary care doctor.

## 2023-01-20 ENCOUNTER — Ambulatory Visit (INDEPENDENT_AMBULATORY_CARE_PROVIDER_SITE_OTHER): Payer: BC Managed Care – PPO | Admitting: Neurology

## 2023-01-20 DIAGNOSIS — R202 Paresthesia of skin: Secondary | ICD-10-CM

## 2023-01-20 NOTE — Procedures (Signed)
[facility]  [address] [place], [address]  [place], [place] [address] Tel: [phone] Fax: [phone] Test Date:  01/20/2023  Patient: Toni Wang DOB: Feb 04, 1981 Physician: Nita Sickle, DO  Sex: Female Height: 5\' 4"  Ref Phys: Nita Sickle, DO  ID#: [id number]   Technician:    History: This is a 42 year old female referred for evaluation of bilateral hand and leg numbness.  NCV & EMG Findings: Extensive electrodiagnostic testing of the right upper and lower extremity shows:  Right median, ulnar, mixed palmar, sural, and superficial peroneal sensory responses are within normal limits. Right median, ulnar, peroneal, and tibial motor responses are within normal limits. Right tibial H reflex study is within normal limits. There is no evidence of active or chronic motor axonal loss changes affecting any of the tested muscles.  Motor unit configuration and recruitment pattern is within normal limits.  Impression: This is a normal study of the right upper and lower extremities.  In particular, there is no evidence of a large fiber sensorimotor polyneuropathy, carpal tunnel syndrome, ulnar neuropathy, or cervical/lumbosacral radiculopathy.   ___________________________ Nita Sickle, DO    Nerve Conduction Studies   Stim Site NR Peak (ms) Norm Peak (ms) O-P Amp (V) Norm O-P Amp  Right Median Anti Sensory (2nd Digit)  32 C  Wrist    2.7 <3.4 73.1 >20  Right Sup Peroneal Anti Sensory (Ant Lat Mall)  32 C  12 cm    2.0 <4.5 13.7 >5  Right Sural Anti Sensory (Lat Mall)  32 C  Calf    3.1 <4.5 21.1 >5  Right Ulnar Anti Sensory (5th Digit)  32 C  Wrist    2.3 <3.1 76.8 >12     Stim Site NR Onset (ms) Norm Onset (ms) O-P Amp (mV) Norm O-P Amp Site1 Site2 Delta-0 (ms) Dist (cm) Vel (m/s) Norm Vel (m/s)  Right Median Motor (Abd Poll Brev)  32 C  Wrist    2.4 <3.9 9.4 >6 Elbow Wrist 4.6 27.0 59 >50  Elbow    7.0  9.0         Right Peroneal Motor (Ext Dig Brev)   32 C  Ankle    2.8 <5.5 4.7 >3 B Fib Ankle 6.7 35.0 52 >40  B Fib    9.5  4.5  Poplt B Fib 1.4 8.0 57 >40  Poplt    10.9  4.5         Right Tibial Motor (Abd Hall Brev)  32 C  Ankle    2.6 <6.0 13.1 >8 Knee Ankle 8.0 39.0 49 >40  Knee    10.6  8.4         Right Ulnar Motor (Abd Dig Minimi)  32 C  Wrist    2.0 <3.1 11.4 >7 B Elbow Wrist 3.0 20.0 67 >50  B Elbow    5.0  10.4  A Elbow B Elbow 1.7 10.0 59 >50  A Elbow    6.7  10.0            Stim Site NR Peak (ms) Norm Peak (ms) P-T Amp (V) Site1 Site2 Delta-P (ms) Norm Delta (ms)  Right Median/Ulnar Palm Comparison (Wrist - 8cm)  32 C  Median Palm    1.5 <2.2 64.9 Median Palm Ulnar Palm 0.2   Ulnar Palm    1.3 <2.2 21.5       Electromyography   Side Muscle Ins.Act Fibs Fasc Recrt Amp Dur Poly Activation Comment  Right 1stDorInt  Nml Nml Nml Nml Nml Nml Nml Nml N/A  Right PronatorTeres Nml Nml Nml Nml Nml Nml Nml Nml N/A  Right Biceps Nml Nml Nml Nml Nml Nml Nml Nml N/A  Right Triceps Nml Nml Nml Nml Nml Nml Nml Nml N/A  Right Deltoid Nml Nml Nml Nml Nml Nml Nml Nml N/A  Right AntTibialis Nml Nml Nml Nml Nml Nml Nml Nml N/A  Right Gastroc Nml Nml Nml Nml Nml Nml Nml Nml N/A  Right Flex Dig Long Nml Nml Nml Nml Nml Nml Nml Nml N/A  Right RectFemoris Nml Nml Nml Nml Nml Nml Nml Nml N/A  Right BicepsFemS Nml Nml Nml Nml Nml Nml Nml Nml N/A      Waveforms:

## 2023-02-27 ENCOUNTER — Ambulatory Visit: Payer: BC Managed Care – PPO

## 2023-03-20 ENCOUNTER — Ambulatory Visit: Admission: RE | Admit: 2023-03-20 | Payer: BC Managed Care – PPO | Source: Ambulatory Visit

## 2023-03-20 DIAGNOSIS — Z1231 Encounter for screening mammogram for malignant neoplasm of breast: Secondary | ICD-10-CM

## 2023-11-08 ENCOUNTER — Encounter: Payer: Self-pay | Admitting: Family Medicine

## 2023-11-08 ENCOUNTER — Ambulatory Visit (INDEPENDENT_AMBULATORY_CARE_PROVIDER_SITE_OTHER): Payer: BC Managed Care – PPO | Admitting: Family Medicine

## 2023-11-08 VITALS — BP 140/86 | HR 79 | Temp 98.6°F | Ht 64.5 in | Wt 170.0 lb

## 2023-11-08 DIAGNOSIS — L03031 Cellulitis of right toe: Secondary | ICD-10-CM | POA: Diagnosis not present

## 2023-11-08 DIAGNOSIS — M79676 Pain in unspecified toe(s): Secondary | ICD-10-CM | POA: Diagnosis not present

## 2023-11-08 MED ORDER — CEPHALEXIN 500 MG PO CAPS
500.0000 mg | ORAL_CAPSULE | Freq: Two times a day (BID) | ORAL | 0 refills | Status: DC
Start: 2023-11-08 — End: 2023-11-13

## 2023-11-08 NOTE — Patient Instructions (Signed)
 I have sent in keflex for you to take twice a day for 5 days.   Please eat when you take this medication, it can upset your stomach if you do not.   Please be sure to complete the course of antibiotics even if you are feeling better.   Follow-up with me for new or worsening symptoms.

## 2023-11-08 NOTE — Progress Notes (Signed)
   Acute Office Visit  Subjective:     Patient ID: Toni Wang, female    DOB: Dec 03, 1980, 43 y.o.   MRN: [id number]  Chief Complaint  Patient presents with   Acute Visit    Happened on Saturday, right foot, big toe. Has been keeping it cleaned, may have cut nail too close to skin    HPI Patient is in today for evaluation of right great toe pain. States that she recently cut her toenails too short, and that medial aspect of her right toe is red, swollen, tender and has been draining fluid. Has been using antibacterial soap and Neosporin to the area. States that she also tried to lance the area at home, only blood was expressed to the. States that this should did not really make much difference in her pain. States that this has happened before and was much worse in the past. Denies chills, fever, red streaking from the area, other concerning symptoms.  ROS Per HPI      Objective:    BP (!) 140/86 (BP Location: Left Arm, Patient Position: Sitting)   Pulse 79   Temp 98.6 F (37 C) (Temporal)   Ht 5' 4.5" (1.638 m)   Wt 170 lb (77.1 kg)   SpO2 98%   BMI 28.73 kg/m    Physical Exam Vitals and nursing note reviewed.  Constitutional:      General: She is not in acute distress.    Appearance: Normal appearance. She is normal weight.  HENT:     Head: Normocephalic and atraumatic.  Eyes:     Extraocular Movements: Extraocular movements intact.  Cardiovascular:     Rate and Rhythm: Normal rate.  Pulmonary:     Effort: Pulmonary effort is normal. No respiratory distress.  Musculoskeletal:     Cervical back: Normal range of motion.  Lymphadenopathy:     Cervical: No cervical adenopathy.  Skin:    Findings: Erythema (Medial aspect of right great toe erythematous, draining thin yellow/green fluid, exquisitely tender, warm to touch) present.  Neurological:     General: No focal deficit present.     Mental Status: She is alert and oriented to person, place, and  time.  Psychiatric:        Mood and Affect: Mood normal.        Thought Content: Thought content normal.    No results found for any visits on 11/08/23.      Assessment & Plan:  1. Cellulitis of toe of right foot (Primary)  - cephALEXin (KEFLEX) 500 MG capsule; Take 1 capsule (500 mg total) by mouth 2 (two) times daily for 5 days.  Dispense: 10 capsule; Refill: 0  2. Pain of toe, unspecified laterality  May take tylenol/ibuprofen as needed   Meds ordered this encounter  Medications   cephALEXin (KEFLEX) 500 MG capsule    Sig: Take 1 capsule (500 mg total) by mouth 2 (two) times daily for 5 days.    Dispense:  10 capsule    Refill:  0    Return if symptoms worsen or fail to improve.  Moshe Cipro, FNP

## 2023-11-13 ENCOUNTER — Telehealth: Payer: Self-pay

## 2023-11-13 DIAGNOSIS — L03031 Cellulitis of right toe: Secondary | ICD-10-CM

## 2023-11-13 MED ORDER — DOXYCYCLINE HYCLATE 100 MG PO CAPS
100.0000 mg | ORAL_CAPSULE | Freq: Two times a day (BID) | ORAL | 0 refills | Status: AC
Start: 2023-11-13 — End: 2023-11-20

## 2023-11-13 NOTE — Telephone Encounter (Signed)
 Copied from CRM [id number]. Topic: General - Other >> Nov 13, 2023  8:41 AM Rodman Pickle T wrote: Reason for CRM: patient is requesting a different antibiotic from the one she was prescribed th one she was prescribed is not working

## 2024-02-06 ENCOUNTER — Other Ambulatory Visit: Payer: Self-pay | Admitting: Obstetrics and Gynecology

## 2024-02-06 DIAGNOSIS — Z1231 Encounter for screening mammogram for malignant neoplasm of breast: Secondary | ICD-10-CM

## 2024-03-26 ENCOUNTER — Ambulatory Visit

## 2024-04-04 ENCOUNTER — Ambulatory Visit
Admission: RE | Admit: 2024-04-04 | Discharge: 2024-04-04 | Disposition: A | Discharge status: Home / Self Care | Source: Ambulatory Visit | Attending: Obstetrics and Gynecology | Admitting: Obstetrics and Gynecology

## 2024-04-04 DIAGNOSIS — Z1231 Encounter for screening mammogram for malignant neoplasm of breast: Secondary | ICD-10-CM
